# Patient Record
Sex: Female | Born: 1995 | Race: Black or African American | Hispanic: No | Marital: Single | Smoking: Never smoker
Health system: Southern US, Community
[De-identification: ages and names within clinical notes are randomized; demographics above are authoritative.]

## PROBLEM LIST (undated history)

## (undated) DIAGNOSIS — J45909 Unspecified asthma, uncomplicated: Secondary | ICD-10-CM

## (undated) DIAGNOSIS — G43909 Migraine, unspecified, not intractable, without status migrainosus: Secondary | ICD-10-CM

## (undated) DIAGNOSIS — Z8619 Personal history of other infectious and parasitic diseases: Secondary | ICD-10-CM

## (undated) DIAGNOSIS — B009 Herpesviral infection, unspecified: Secondary | ICD-10-CM

## (undated) HISTORY — PX: WISDOM TOOTH EXTRACTION: SHX21

## (undated) HISTORY — DX: Herpesviral infection, unspecified: B00.9

## (undated) HISTORY — PX: APPENDECTOMY: SHX54

## (undated) HISTORY — DX: Personal history of other infectious and parasitic diseases: Z86.19

## (undated) HISTORY — PX: TONSILLECTOMY AND ADENOIDECTOMY: SUR1326

---
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
## 2015-09-19 NOTE — L&D Delivery Note (Signed)
Delivery Note At 11:55 PM a viable female was delivered via Vaginal, Spontaneous Delivery (Presentation:DOP ).  A discussion was held with the patient regarding options secondary to prolonged second stage which included vacuum and c-section deliveries.  Patient declined VAVD and was able to ultimately push the baby out after 3 more contractions in DOP presentation.  APGAR: 7, 9; weight  .  Placenta status: spont intact .  Cord:  3V with the following complications: none.  Cord pH: n/a  Anesthesia:   Episiotomy: None Lacerations:  Partial 4th degree (start of rectal mucosa torn for about a cm or less) Suture Repair: 2.0 3.0 vicryl and 0 vicryl Est. Blood Loss (mL):  300cc  Mom to postpartum.  Baby to Couplet care / Skin to Skin.  Terry NailsROBERTS,Terry Ryser Y 09/15/2016, 12:49 AM

## 2015-12-22 LAB — OB RESULTS CONSOLE GC/CHLAMYDIA: Chlamydia: POSITIVE

## 2016-01-31 LAB — OB RESULTS CONSOLE ANTIBODY SCREEN: ANTIBODY SCREEN: NEGATIVE

## 2016-01-31 LAB — OB RESULTS CONSOLE GC/CHLAMYDIA: GC PROBE AMP, GENITAL: NEGATIVE

## 2016-01-31 LAB — OB RESULTS CONSOLE ABO/RH: RH TYPE: POSITIVE

## 2016-01-31 LAB — OB RESULTS CONSOLE RPR: RPR: NONREACTIVE

## 2016-01-31 LAB — OB RESULTS CONSOLE RUBELLA ANTIBODY, IGM: Rubella: IMMUNE

## 2016-01-31 LAB — OB RESULTS CONSOLE HIV ANTIBODY (ROUTINE TESTING): HIV: NONREACTIVE

## 2016-01-31 LAB — OB RESULTS CONSOLE HEPATITIS B SURFACE ANTIGEN: HEP B S AG: NEGATIVE

## 2016-08-15 LAB — OB RESULTS CONSOLE GC/CHLAMYDIA
Chlamydia: NEGATIVE
GC PROBE AMP, GENITAL: NEGATIVE

## 2016-08-15 LAB — OB RESULTS CONSOLE GBS: GBS: POSITIVE

## 2016-08-25 ENCOUNTER — Other Ambulatory Visit (HOSPITAL_COMMUNITY): Payer: Self-pay | Admitting: Obstetrics and Gynecology

## 2016-08-25 DIAGNOSIS — Z3A38 38 weeks gestation of pregnancy: Secondary | ICD-10-CM

## 2016-08-25 DIAGNOSIS — Z3689 Encounter for other specified antenatal screening: Secondary | ICD-10-CM

## 2016-08-25 DIAGNOSIS — O26849 Uterine size-date discrepancy, unspecified trimester: Secondary | ICD-10-CM

## 2016-09-01 ENCOUNTER — Ambulatory Visit (HOSPITAL_COMMUNITY)
Admission: RE | Admit: 2016-09-01 | Discharge: 2016-09-01 | Disposition: A | Discharge status: Home / Self Care | Payer: Managed Care, Other (non HMO) | Source: Ambulatory Visit | Attending: Obstetrics and Gynecology | Admitting: Obstetrics and Gynecology

## 2016-09-01 DIAGNOSIS — Z363 Encounter for antenatal screening for malformations: Secondary | ICD-10-CM | POA: Insufficient documentation

## 2016-09-01 DIAGNOSIS — Z3689 Encounter for other specified antenatal screening: Secondary | ICD-10-CM | POA: Insufficient documentation

## 2016-09-01 DIAGNOSIS — O26849 Uterine size-date discrepancy, unspecified trimester: Secondary | ICD-10-CM

## 2016-09-01 DIAGNOSIS — Z3A38 38 weeks gestation of pregnancy: Secondary | ICD-10-CM | POA: Insufficient documentation

## 2016-09-01 DIAGNOSIS — O26843 Uterine size-date discrepancy, third trimester: Secondary | ICD-10-CM | POA: Insufficient documentation

## 2016-09-01 NOTE — Addendum Note (Signed)
Encounter addended by: Lenoard Adenlivia M Cire Deyarmin, RT on: 09/01/2016  4:15 PM<BR>    Actions taken: Imaging Exam ended

## 2016-09-08 ENCOUNTER — Other Ambulatory Visit (HOSPITAL_COMMUNITY): Payer: Self-pay | Admitting: Obstetrics and Gynecology

## 2016-09-08 DIAGNOSIS — Z3A40 40 weeks gestation of pregnancy: Secondary | ICD-10-CM

## 2016-09-08 DIAGNOSIS — O480 Post-term pregnancy: Secondary | ICD-10-CM

## 2016-09-09 ENCOUNTER — Inpatient Hospital Stay (HOSPITAL_COMMUNITY)
Admission: AD | Admit: 2016-09-09 | Payer: Private Health Insurance - Indemnity | Source: Ambulatory Visit | Admitting: Obstetrics and Gynecology

## 2016-09-12 ENCOUNTER — Telehealth (HOSPITAL_COMMUNITY): Payer: Self-pay | Admitting: *Deleted

## 2016-09-12 ENCOUNTER — Encounter (HOSPITAL_COMMUNITY): Payer: Self-pay | Admitting: *Deleted

## 2016-09-12 NOTE — Telephone Encounter (Signed)
Preadmission screen  

## 2016-09-13 ENCOUNTER — Other Ambulatory Visit: Payer: Self-pay | Admitting: Obstetrics and Gynecology

## 2016-09-13 NOTE — H&P (Signed)
Terry Mcdaniel is a 20 y.o. female  G1 P0 at 40 wks and 5 days EGA based on 8 wk ultrasound with EDD 09/09/2016 presenting for induction due to postdates.  Prentaal care provided by Dr. Gerald Leitzara Mercedez Boule with [facility]. 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 Pt denies any current outbreak.   Ultrasound 09/01/2016 fetus was 8 lbs 6 oz >90%ile.    OB History    Gravida Para Term Preterm AB Living   1             SAB TAB Ectopic Multiple Live Births                 Past Medical History:  Diagnosis Date  . Asthma   . Herpes   . History of chlamydia   . Migraine    Past Surgical History:  Procedure Laterality Date  . LAPAROSCOPIC APPENDECTOMY  05/15/2012   Procedure: APPENDECTOMY LAPAROSCOPIC;  Surgeon: Judie PetitM. Leonia CoronaShuaib Farooqui, MD;  Location: [facility] OR;  Service: Pediatrics;  Laterality: N/A;  . TONSILLECTOMY AND ADENOIDECTOMY     Family History: family history includes ADD / ADHD in her other; Autism in her maternal aunt; Bipolar disorder in her other; Breast cancer in her maternal grandmother; Hearing loss in her cousin; Migraines in her mother and paternal aunt; Tics in her other. Social History:  reports that she has never smoked. She has never used smokeless tobacco. She reports that she does not drink alcohol or use drugs.   Allergies : Penicillin Medication: Valtrex/ Prenatal vitamins.      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   HENT: Negative.   Eyes: Negative.   Respiratory: Negative.   Cardiovascular: Negative.   Gastrointestinal: Negative.   Genitourinary: Negative.   Musculoskeletal:  Negative.   Skin: Negative.   Neurological: Negative.   Endo/Heme/Allergies: Negative.   Psychiatric/Behavioral: Negative.    Maternal Medical History:  Fetal activity: Perceived fetal activity is normal.    Prenatal complications: Infection.   Prenatal Complications - Diabetes: none.      There were no vitals taken for this visit. Maternal Exam:  Uterine Assessment: Contraction frequency is irregular.   Abdomen: Patient reports no abdominal tenderness. Estimated fetal weight is 9 lbs 0 oz .   Fetal presentation: vertex  Introitus: Normal vulva. Normal vagina.  Pelvis: adequate for delivery.      Physical Exam  Vitals reviewed. Constitutional: She is oriented to person, place, and time. She appears well-developed and well-nourished.  HENT:  Head: Normocephalic and atraumatic.  Eyes: Conjunctivae are normal. Pupils are equal, round, and reactive to light.  Neck: Normal range of motion. Neck supple.  Cardiovascular: Normal rate and regular rhythm.   Respiratory: Effort normal and breath sounds normal.  GI: Bowel sounds are normal. There is no tenderness.  Genitourinary: Vagina normal.  Musculoskeletal: Normal range of motion. She exhibits no edema.  Neurological: She is alert and oriented to person, place, and time. She has normal reflexes.  Skin: Skin is warm and dry.  Psychiatric: She has a normal mood and affect.    Prenatal labs: ABO, Rh: A/Positive/-- (05/15 0000) Antibody: Negative (05/15 0000) Rubella: Immune (  05/15 0000) RPR: Nonreactive (05/15 0000)  HBsAg: Negative (05/15 0000)  HIV: Non-reactive (05/15 0000)  GBS: Positive (11/28 0000)   Assessment/Plan:  40 wks and 5 days post dates for induction. Increased r/o cesarean section associated with induction was discussed with the patient and she accepts this risk.  Plan pitocin for induction  Clindamycin for GBS prophylaxis as pt is allergic to penicllin.  HSV no active lesions on exam in the office.  Dr.  Charlotta Newtonzan covering midnight 09/14/2016 until 7am .  I will assume care at 7am on 09/14/2016.  Duong Haydel J. 09/13/2016, 5:23 PM

## 2016-09-14 ENCOUNTER — Inpatient Hospital Stay (HOSPITAL_COMMUNITY): Payer: Managed Care, Other (non HMO) | Admitting: Anesthesiology

## 2016-09-14 ENCOUNTER — Encounter (HOSPITAL_COMMUNITY): Payer: Self-pay

## 2016-09-14 ENCOUNTER — Inpatient Hospital Stay (HOSPITAL_COMMUNITY)
Admission: RE | Admit: 2016-09-14 | Discharge: 2016-09-16 | DRG: 775 | Disposition: A | Discharge status: Home / Self Care | Payer: Managed Care, Other (non HMO) | Source: Ambulatory Visit | Attending: Obstetrics and Gynecology | Admitting: Obstetrics and Gynecology

## 2016-09-14 DIAGNOSIS — Z3A40 40 weeks gestation of pregnancy: Secondary | ICD-10-CM | POA: Diagnosis not present

## 2016-09-14 DIAGNOSIS — O480 Post-term pregnancy: Principal | ICD-10-CM | POA: Diagnosis present

## 2016-09-14 DIAGNOSIS — O1494 Unspecified pre-eclampsia, complicating childbirth: Secondary | ICD-10-CM | POA: Diagnosis present

## 2016-09-14 LAB — PROTEIN / CREATININE RATIO, URINE
CREATININE, URINE: 33 mg/dL
Creatinine, Urine: 65 mg/dL
PROTEIN CREATININE RATIO: 0.33 mg/mg{creat} — AB (ref 0.00–0.15)
PROTEIN CREATININE RATIO: 0.31 mg/mg{creat} — AB (ref 0.00–0.15)
TOTAL PROTEIN, URINE: 11 mg/dL
Total Protein, Urine: 20 mg/dL

## 2016-09-14 LAB — COMPREHENSIVE METABOLIC PANEL
ALT: 15 U/L (ref 14–54)
ANION GAP: 6 (ref 5–15)
AST: 25 U/L (ref 15–41)
Albumin: 2.7 g/dL — ABNORMAL LOW (ref 3.5–5.0)
Alkaline Phosphatase: 246 U/L — ABNORMAL HIGH (ref 38–126)
BUN: 7 mg/dL (ref 6–20)
CALCIUM: 8.7 mg/dL — AB (ref 8.9–10.3)
CHLORIDE: 111 mmol/L (ref 101–111)
CO2: 19 mmol/L — AB (ref 22–32)
Creatinine, Ser: 0.56 mg/dL (ref 0.44–1.00)
GFR calc Af Amer: >60 mL/min (ref >60)
GFR calc non Af Amer: >60 mL/min (ref >60)
Glucose, Bld: 82 mg/dL (ref 65–99)
POTASSIUM: 3.9 mmol/L (ref 3.5–5.1)
SODIUM: 136 mmol/L (ref 135–145)
Total Bilirubin: 0.3 mg/dL (ref 0.3–1.2)
Total Protein: 6.1 g/dL — ABNORMAL LOW (ref 6.5–8.1)

## 2016-09-14 LAB — ABO/RH: ABO/RH(D): A POS

## 2016-09-14 LAB — CBC
HCT: 38.1 % (ref 36.0–46.0)
HCT: 37.6 % (ref 36.0–46.0)
HEMOGLOBIN: 13.3 g/dL (ref 12.0–15.0)
Hemoglobin: 13.3 g/dL (ref 12.0–15.0)
MCH: 32.9 pg (ref 26.0–34.0)
MCH: 33.3 pg (ref 26.0–34.0)
MCHC: 34.9 g/dL (ref 30.0–36.0)
MCHC: 35.4 g/dL (ref 30.0–36.0)
MCV: 94.3 fL (ref 78.0–100.0)
MCV: 94.2 fL (ref 78.0–100.0)
PLATELETS: 231 10*3/uL (ref 150–400)
PLATELETS: 221 10*3/uL (ref 150–400)
RBC: 4.04 MIL/uL (ref 3.87–5.11)
RBC: 3.99 MIL/uL (ref 3.87–5.11)
RDW: 13.5 % (ref 11.5–15.5)
RDW: 14 % (ref 11.5–15.5)
WBC: 11.6 10*3/uL — AB (ref 4.0–10.5)
WBC: 10.2 10*3/uL (ref 4.0–10.5)

## 2016-09-14 LAB — TYPE AND SCREEN
ABO/RH(D): A POS
ANTIBODY SCREEN: NEGATIVE

## 2016-09-14 LAB — URIC ACID: URIC ACID, SERUM: 4.7 mg/dL (ref 2.3–6.6)

## 2016-09-14 LAB — RPR: RPR: NONREACTIVE

## 2016-09-14 MED ORDER — OXYCODONE-ACETAMINOPHEN 5-325 MG PO TABS: 1.0000 | ORAL_TABLET | ORAL | Status: DC | PRN

## 2016-09-14 MED ORDER — PHENYLEPHRINE 40 MCG/ML (10ML) SYRINGE FOR IV PUSH (FOR BLOOD PRESSURE SUPPORT)
80.0000 ug | PREFILLED_SYRINGE | INTRAVENOUS | Status: DC | PRN
  Filled 2016-09-14: qty 5

## 2016-09-14 MED ORDER — EPHEDRINE 5 MG/ML INJ
10.0000 mg | INTRAVENOUS | Status: DC | PRN
  Filled 2016-09-14: qty 4

## 2016-09-14 MED ORDER — ONDANSETRON HCL 4 MG/2ML IJ SOLN: 4.0000 mg | Freq: Four times a day (QID) | INTRAMUSCULAR | Status: DC | PRN

## 2016-09-14 MED ORDER — BUTORPHANOL TARTRATE 1 MG/ML IJ SOLN
1.0000 mg | INTRAMUSCULAR | Status: DC | PRN
  Administered 2016-09-14 (×2): 1 mg via INTRAVENOUS
  Filled 2016-09-14 (×2): qty 1

## 2016-09-14 MED ORDER — PHENYLEPHRINE 40 MCG/ML (10ML) SYRINGE FOR IV PUSH (FOR BLOOD PRESSURE SUPPORT)
80.0000 ug | PREFILLED_SYRINGE | INTRAVENOUS | Status: DC | PRN
  Filled 2016-09-14: qty 5
  Filled 2016-09-14: qty 10

## 2016-09-14 MED ORDER — HYDROXYZINE HCL 50 MG PO TABS
50.0000 mg | ORAL_TABLET | Freq: Four times a day (QID) | ORAL | Status: DC | PRN
  Filled 2016-09-14: qty 1

## 2016-09-14 MED ORDER — CLINDAMYCIN PHOSPHATE 900 MG/50ML IV SOLN
900.0000 mg | Freq: Three times a day (TID) | INTRAVENOUS | Status: DC
  Administered 2016-09-14 (×3): 900 mg via INTRAVENOUS
  Filled 2016-09-14 (×6): qty 50

## 2016-09-14 MED ORDER — OXYTOCIN 40 UNITS IN LACTATED RINGERS INFUSION - SIMPLE MED
1.0000 m[IU]/min | INTRAVENOUS | Status: DC
Start: 2016-09-14 — End: 2016-09-15
  Administered 2016-09-14: 1 m[IU]/min via INTRAVENOUS

## 2016-09-14 MED ORDER — OXYTOCIN 40 UNITS IN LACTATED RINGERS INFUSION - SIMPLE MED
1.0000 m[IU]/min | INTRAVENOUS | Status: DC
  Administered 2016-09-14: 1 m[IU]/min via INTRAVENOUS
  Filled 2016-09-14: qty 1000

## 2016-09-14 MED ORDER — LACTATED RINGERS IV SOLN
500.0000 mL | Freq: Once | INTRAVENOUS | Status: AC
  Administered 2016-09-14: 1000 mL via INTRAVENOUS

## 2016-09-14 MED ORDER — FENTANYL 2.5 MCG/ML BUPIVACAINE 1/10 % EPIDURAL INFUSION (WH - ANES)
14.0000 mL/h | INTRAMUSCULAR | Status: DC | PRN
  Administered 2016-09-14 (×2): 14 mL/h via EPIDURAL
  Filled 2016-09-14 (×2): qty 100

## 2016-09-14 MED ORDER — MISOPROSTOL 200 MCG PO TABS
ORAL_TABLET | ORAL | Status: AC
  Filled 2016-09-14: qty 4

## 2016-09-14 MED ORDER — LIDOCAINE HCL (PF) 1 % IJ SOLN
INTRAMUSCULAR | Status: DC | PRN
  Administered 2016-09-14 (×2): 5 mL via EPIDURAL

## 2016-09-14 MED ORDER — ACETAMINOPHEN 325 MG PO TABS
650.0000 mg | ORAL_TABLET | ORAL | Status: DC | PRN
  Administered 2016-09-14: 650 mg via ORAL
  Filled 2016-09-14: qty 2

## 2016-09-14 MED ORDER — LACTATED RINGERS IV SOLN
500.0000 mL | INTRAVENOUS | Status: DC | PRN
  Administered 2016-09-14: 1000 mL via INTRAVENOUS

## 2016-09-14 MED ORDER — OXYTOCIN 40 UNITS IN LACTATED RINGERS INFUSION - SIMPLE MED
2.5000 [IU]/h | INTRAVENOUS | Status: DC
  Administered 2016-09-15: 2.5 [IU]/h via INTRAVENOUS
  Filled 2016-09-14: qty 1000

## 2016-09-14 MED ORDER — SOD CITRATE-CITRIC ACID 500-334 MG/5ML PO SOLN: 30.0000 mL | ORAL | Status: DC | PRN

## 2016-09-14 MED ORDER — LACTATED RINGERS IV SOLN
INTRAVENOUS | Status: DC
  Administered 2016-09-14 (×2): via INTRAVENOUS

## 2016-09-14 MED ORDER — DIPHENHYDRAMINE HCL 50 MG/ML IJ SOLN: 12.5000 mg | INTRAMUSCULAR | Status: DC | PRN

## 2016-09-14 MED ORDER — TERBUTALINE SULFATE 1 MG/ML IJ SOLN
0.2500 mg | Freq: Once | INTRAMUSCULAR | Status: DC | PRN
  Filled 2016-09-14: qty 1

## 2016-09-14 MED ORDER — OXYCODONE-ACETAMINOPHEN 5-325 MG PO TABS: 2.0000 | ORAL_TABLET | ORAL | Status: DC | PRN

## 2016-09-14 MED ORDER — OXYTOCIN BOLUS FROM INFUSION
500.0000 mL | Freq: Once | INTRAVENOUS | Status: AC
  Administered 2016-09-14: 500 mL via INTRAVENOUS

## 2016-09-14 MED ORDER — LIDOCAINE HCL (PF) 1 % IJ SOLN
30.0000 mL | INTRAMUSCULAR | Status: DC | PRN
  Filled 2016-09-14: qty 30

## 2016-09-14 NOTE — Progress Notes (Signed)
Terry HanksBriana M Mcdaniel is a 20 y.o. G1P0000 at 4918w5d by ultrasound admitted for induction of labor due to Post dates. Due date 09/09/2016.  Subjective: Pt states contractions are uncomfortable not painful yet. +FM no leakage of fluid. She denies any active HSV lesions   Objective: BP 122/78   Pulse 81   Temp 99 F (37.2 C) (Oral)   Resp 20   Ht 5\' 4"  (1.626 m)   Wt 78.4 kg (172 lb 12.8 oz)   SpO2 100%   BMI 29.66 kg/m  No intake/output data recorded. No intake/output data recorded.  FHT:  FHR: 125 bpm, variability: moderate,  accelerations:  Present,  decelerations:  Absent UC:   irregular, every 1-5 minutes SVE:   Dilation: 2.5 Effacement (%): 90 Station: -2 Exam by:: Occidental Petroleumkristy lashley,rn   Perineum examined no active lesions seen.  Labs: Lab Results  Component Value Date   WBC 11.6 (H) 09/14/2016   HGB 13.3 09/14/2016   HCT 38.1 09/14/2016   MCV 94.3 09/14/2016   PLT 231 09/14/2016    Assessment / Plan: Induction of labor due to postterm,  progressing well on pitocin  Labor: continue pitocine will increase to go up every 30 minutes by 2 mu  Preeclampsia:  NA Fetal Wellbeing:  Category I Pain Control:  pt planning on epidural  I/D:  clindamycin Anticipated MOD:  NSVD  Terry Mcdaniel J. 09/14/2016, 7:31 AM

## 2016-09-14 NOTE — Anesthesia Procedure Notes (Signed)
Epidural Patient location during procedure: OB Start time: 09/14/2016 1:51 PM End time: 09/14/2016 2:01 PM  Staffing Anesthesiologist: Heather RobertsSINGER, Kashia Brossard Performed: anesthesiologist   Preanesthetic Checklist Completed: patient identified, site marked, pre-op evaluation, timeout performed, IV checked, risks and benefits discussed and monitors and equipment checked  Epidural Patient position: sitting Prep: DuraPrep Patient monitoring: heart rate, cardiac monitor, continuous pulse ox and blood pressure Approach: midline Location: L2-L3 Injection technique: LOR saline  Needle:  Needle type: Tuohy  Needle gauge: 17 G Needle length: 9 cm Needle insertion depth: 6 cm Catheter size: 20 Guage Catheter at skin depth: 11 cm Test dose: negative and Other  Assessment Events: blood not aspirated, injection not painful, no injection resistance and negative IV test  Additional Notes Informed consent obtained prior to proceeding including risk of failure, 1% risk of PDPH, risk of minor discomfort and bruising.  Discussed rare but serious complications including epidural abscess, permanent nerve injury, epidural hematoma.  Discussed alternatives to epidural analgesia and patient desires to proceed.  Timeout performed pre-procedure verifying patient name, procedure, and platelet count.  Patient tolerated procedure well.

## 2016-09-14 NOTE — Anesthesia Pain Management Evaluation Note (Signed)
  CRNA Pain Management Visit Note  Patient: Terry Mcdaniel, 20 y.o., female  "Hello I am a member of the anesthesia team at [facility]. We have an anesthesia team available at all times to provide care throughout the hospital, including epidural management and anesthesia for C-section. I don't know your plan for the delivery whether it a natural birth, water birth, IV sedation, nitrous supplementation, doula or epidural, but we want to meet your pain goals."   1.Was your pain managed to your expectations on prior hospitalizations?   Yes   2.What is your expectation for pain management during this hospitalization?     Epidural  3.How can we help you reach that goal? epidural  Record the patient's initial score and the patient's pain goal.   Pain: 0/10  Pain Goal: 0/10 [facility] wants you to be able to say your pain was always managed very well.  Terry Mcdaniel, [name] 09/14/2016

## 2016-09-14 NOTE — Progress Notes (Signed)
Margit HanksBriana M Szafran is a 20 y.o. G1P0000 at 4620w5d by ultrasound admitted for induction of labor due to Post dates. Due date 09/09/2016.  Subjective: Patient reports contractions more intense. No lof .Marland Kitchen. Bloody show present.   Objective: BP 133/86 (BP Location: Right Arm)   Pulse 82   Temp 98 F (36.7 C) (Oral)   Resp 18   Ht 5\' 4"  (1.626 m)   Wt 78.4 kg (172 lb 12.8 oz)   SpO2 100%   BMI 29.66 kg/m  No intake/output data recorded. No intake/output data recorded.  FHT:  FHR: 120 bpm, variability: moderate,  accelerations:  Present,  decelerations:  Absent UC:   regular, every 2 minutes SVE:  4-5/90/-2 Labs: Lab Results  Component Value Date   WBC 10.2 09/14/2016   HGB 13.3 09/14/2016   HCT 37.6 09/14/2016   MCV 94.2 09/14/2016   PLT 221 09/14/2016    Assessment / Plan: Induction of labor due to postterm,  progressing well on pitocin  Labor: Progressing on Pitocin, will continue to increase then AROM Preeclampsia:  NA Fetal Wellbeing:  Category I Pain Control:  IV pain meds I/D:  Clindamycin Anticipated MOD:  NSVD  Taneia Mealor J. 09/14/2016, 1:29 PM

## 2016-09-14 NOTE — Progress Notes (Signed)
Terry HanksBriana M Mcdaniel is a 20 y.o. G1P0000 at 3653w5d by ultrasound admitted for induction of labor due to Post dates. Due date 09/09/2016.  Subjective: Patient reports feeling more pressure. She feels like she needs to have a bowel movement.  She is comfortable with her epidural   Objective: BP (!) 129/98 (BP Location: Right Arm) Comment: pushing  Pulse 97   Temp 97.7 F (36.5 C) (Oral)   Resp 20   Ht 5\' 4"  (1.626 m)   Wt 78.4 kg (172 lb 12.8 oz)   SpO2 99%   BMI 29.66 kg/m  No intake/output data recorded. Total I/O In: -  Out: 550 [Urine:550]  FHT:  FHR: 115 bpm, variability: moderate,  accelerations:  Present,  decelerations:  Absent UC:   regular, every 1=2 minutes SVE:   Dilation: 10 Effacement (%): 100 Station: +1 Exam by:: Dr. Richardson Doppole  Labs: Lab Results  Component Value Date   WBC 10.2 09/14/2016   HGB 13.3 09/14/2016   HCT 37.6 09/14/2016   MCV 94.2 09/14/2016   PLT 221 09/14/2016    Assessment / Plan: Induction of labor due to postterm,  progressing well on pitocin  Labor: completely dilated feeling more pressure will start pushing  Preeclampsia:  repeat protein creatinine ratio as the first sample contained blood. submitted a urine catheterized sample  Fetal Wellbeing:  Category I Pain Control:  Epidural I/D:  Clindamycin Anticipated MOD:  NSVD  Terry Mcdaniel prothero CNM and Dr. Osborn CohoAngela Mcdaniel assuming care at 7 pm...   Terry Tift J. 09/14/2016, 6:54 PM

## 2016-09-14 NOTE — Anesthesia Preprocedure Evaluation (Signed)
Anesthesia Evaluation  Patient identified by MRN, date of birth, ID band Patient awake    Reviewed: Allergy & Precautions, NPO status , Patient's Chart, lab work & pertinent test results  Airway Mallampati: II  TM Distance: >3 FB Neck ROM: Full    Dental no notable dental hx. (+) Dental Advisory Given   Pulmonary asthma ,    Pulmonary exam normal        Cardiovascular negative cardio ROS Normal cardiovascular exam     Neuro/Psych  Headaches, negative psych ROS   GI/Hepatic negative GI ROS, Neg liver ROS,   Endo/Other  negative endocrine ROS  Renal/GU negative Renal ROS  negative genitourinary   Musculoskeletal negative musculoskeletal ROS (+)   Abdominal   Peds negative pediatric ROS (+)  Hematology negative hematology ROS (+)   Anesthesia Other Findings   Reproductive/Obstetrics (+) Pregnancy                             Anesthesia Physical Anesthesia Plan  ASA: II  Anesthesia Plan: Epidural   Post-op Pain Management:    Induction:   Airway Management Planned: Natural Airway and Simple Face Mask  Additional Equipment:   Intra-op Plan:   Post-operative Plan:   Informed Consent: I have reviewed the patients History and Physical, chart, labs and discussed the procedure including the risks, benefits and alternatives for the proposed anesthesia with the patient or authorized representative who has indicated his/her understanding and acceptance.   Dental advisory given  Plan Discussed with:   Anesthesia Plan Comments:         Anesthesia Quick Evaluation

## 2016-09-14 NOTE — Progress Notes (Signed)
Subjective: Starting to feel a little more pressure.  Support in room. Objective: BP 138/89   Pulse 86   Temp 98.3 F (36.8 C) (Oral)   Resp 18   Ht 5\' 4"  (1.626 m)   Wt 78.4 kg (172 lb 12.8 oz)   SpO2 99%   BMI 29.66 kg/m  I/O last 3 completed shifts: In: -  Out: 550 [Urine:550] No intake/output data recorded.  UPC .31  FHT: Category 1 UC:   Coupling and every 3. SVE:   Dilation: 10 Effacement (%): 100 Station: 0 Exam by:: nancy prothero,cnm Pitocin at 1 mu   Assessment:  Pt is a 20 yo G1P0 at 40.5 IUP active labor Cat 1 strip Pre Eclampsia without severe features Plan: Labor down 30 minutes and resume pushing Dr. Su Hiltoberts aware of poc  Kenney HousemanNancy Jean Prothero CNM, MSN 09/14/2016, 8:27 PM

## 2016-09-15 ENCOUNTER — Ambulatory Visit (HOSPITAL_COMMUNITY): Payer: Managed Care, Other (non HMO)

## 2016-09-15 ENCOUNTER — Encounter (HOSPITAL_COMMUNITY): Payer: Self-pay

## 2016-09-15 LAB — CBC
HCT: 39 % (ref 36.0–46.0)
HCT: 37.3 % (ref 36.0–46.0)
HEMATOCRIT: 36.4 % (ref 36.0–46.0)
HEMOGLOBIN: 13.9 g/dL (ref 12.0–15.0)
HEMOGLOBIN: 12.8 g/dL (ref 12.0–15.0)
HEMOGLOBIN: 13 g/dL (ref 12.0–15.0)
MCH: 33.2 pg (ref 26.0–34.0)
MCH: 32.5 pg (ref 26.0–34.0)
MCH: 32.7 pg (ref 26.0–34.0)
MCHC: 35.6 g/dL (ref 30.0–36.0)
MCHC: 34.9 g/dL (ref 30.0–36.0)
MCHC: 35.2 g/dL (ref 30.0–36.0)
MCV: 93.1 fL (ref 78.0–100.0)
MCV: 92.9 fL (ref 78.0–100.0)
MCV: 93.3 fL (ref 78.0–100.0)
Platelets: 213 10*3/uL (ref 150–400)
Platelets: 211 10*3/uL (ref 150–400)
Platelets: 211 10*3/uL (ref 150–400)
RBC: 4.19 MIL/uL (ref 3.87–5.11)
RBC: 3.92 MIL/uL (ref 3.87–5.11)
RBC: 4 MIL/uL (ref 3.87–5.11)
RDW: 14 % (ref 11.5–15.5)
RDW: 14 % (ref 11.5–15.5)
RDW: 14 % (ref 11.5–15.5)
WBC: 15.7 10*3/uL — AB (ref 4.0–10.5)
WBC: 14.2 10*3/uL — ABNORMAL HIGH (ref 4.0–10.5)
WBC: 14.9 10*3/uL — AB (ref 4.0–10.5)

## 2016-09-15 LAB — COMPREHENSIVE METABOLIC PANEL
ALK PHOS: 218 U/L — AB (ref 38–126)
ALT: 16 U/L (ref 14–54)
ANION GAP: 9 (ref 5–15)
AST: 40 U/L (ref 15–41)
Albumin: 2.5 g/dL — ABNORMAL LOW (ref 3.5–5.0)
BUN: 8 mg/dL (ref 6–20)
CALCIUM: 8.7 mg/dL — AB (ref 8.9–10.3)
CO2: 19 mmol/L — AB (ref 22–32)
CREATININE: 0.76 mg/dL (ref 0.44–1.00)
Chloride: 107 mmol/L (ref 101–111)
Glucose, Bld: 112 mg/dL — ABNORMAL HIGH (ref 65–99)
Potassium: 3.7 mmol/L (ref 3.5–5.1)
SODIUM: 135 mmol/L (ref 135–145)
TOTAL PROTEIN: 5.9 g/dL — AB (ref 6.5–8.1)
Total Bilirubin: 0.6 mg/dL (ref 0.3–1.2)

## 2016-09-15 LAB — LACTATE DEHYDROGENASE: LDH: 323 U/L — AB (ref 98–192)

## 2016-09-15 LAB — URIC ACID: URIC ACID, SERUM: 6 mg/dL (ref 2.3–6.6)

## 2016-09-15 MED ORDER — BENZOCAINE-MENTHOL 20-0.5 % EX AERO
1.0000 "application " | INHALATION_SPRAY | CUTANEOUS | Status: DC | PRN
  Administered 2016-09-15: 1 via TOPICAL
  Filled 2016-09-15: qty 56

## 2016-09-15 MED ORDER — ONDANSETRON HCL 4 MG/2ML IJ SOLN: 4.0000 mg | INTRAMUSCULAR | Status: DC | PRN

## 2016-09-15 MED ORDER — ALBUTEROL SULFATE (2.5 MG/3ML) 0.083% IN NEBU: 3.0000 mL | INHALATION_SOLUTION | Freq: Four times a day (QID) | RESPIRATORY_TRACT | Status: DC | PRN

## 2016-09-15 MED ORDER — PRENATAL MULTIVITAMIN CH
1.0000 | ORAL_TABLET | Freq: Every day | ORAL | Status: DC
  Administered 2016-09-15: 1 via ORAL
  Filled 2016-09-15: qty 1

## 2016-09-15 MED ORDER — VALACYCLOVIR HCL 500 MG PO TABS
500.0000 mg | ORAL_TABLET | Freq: Every day | ORAL | Status: DC
  Administered 2016-09-15: 500 mg via ORAL
  Filled 2016-09-15: qty 1

## 2016-09-15 MED ORDER — ZOLPIDEM TARTRATE 5 MG PO TABS: 5.0000 mg | ORAL_TABLET | Freq: Every evening | ORAL | Status: DC | PRN

## 2016-09-15 MED ORDER — SIMETHICONE 80 MG PO CHEW: 80.0000 mg | CHEWABLE_TABLET | ORAL | Status: DC | PRN

## 2016-09-15 MED ORDER — SENNOSIDES-DOCUSATE SODIUM 8.6-50 MG PO TABS
2.0000 | ORAL_TABLET | ORAL | Status: DC
  Administered 2016-09-15: 2 via ORAL
  Filled 2016-09-15: qty 2

## 2016-09-15 MED ORDER — IBUPROFEN 600 MG PO TABS
600.0000 mg | ORAL_TABLET | Freq: Four times a day (QID) | ORAL | Status: DC
  Administered 2016-09-15 – 2016-09-16 (×6): 600 mg via ORAL
  Filled 2016-09-15 (×5): qty 1

## 2016-09-15 MED ORDER — COCONUT OIL OIL: 1.0000 "application " | TOPICAL_OIL | Status: DC | PRN

## 2016-09-15 MED ORDER — WITCH HAZEL-GLYCERIN EX PADS: 1.0000 "application " | MEDICATED_PAD | CUTANEOUS | Status: DC | PRN

## 2016-09-15 MED ORDER — ONDANSETRON HCL 4 MG PO TABS: 4.0000 mg | ORAL_TABLET | ORAL | Status: DC | PRN

## 2016-09-15 MED ORDER — DIPHENHYDRAMINE HCL 25 MG PO CAPS: 25.0000 mg | ORAL_CAPSULE | Freq: Four times a day (QID) | ORAL | Status: DC | PRN

## 2016-09-15 MED ORDER — DIBUCAINE 1 % RE OINT: 1.0000 "application " | TOPICAL_OINTMENT | RECTAL | Status: DC | PRN

## 2016-09-15 MED ORDER — TETANUS-DIPHTH-ACELL PERTUSSIS 5-2.5-18.5 LF-MCG/0.5 IM SUSP: 0.5000 mL | Freq: Once | INTRAMUSCULAR | Status: DC

## 2016-09-15 MED ORDER — ACETAMINOPHEN 325 MG PO TABS: 650.0000 mg | ORAL_TABLET | ORAL | Status: DC | PRN

## 2016-09-15 NOTE — Lactation Note (Signed)
This note was copied from a baby's chart. Lactation Consultation Note  Patient Name: Terry Mcdaniel: 09/15/2016 Reason for consult: Initial assessment  With this mom and term baby, now 8014 hours old. Mom was able to get baby latched, but he would not suckle.. I assisted mom with latching baby in cross cdadle hold, hand expressed colostrum easily, and baby latched easily, few suckles, and needed constant stimulation to get him to intermittently suck at the breast. After about 15 minutes of above, Mom agreed to me hand expressing her colostrum and spoon feeding the baby. He suckled 4 ml's from the spoon, doing well with tongue extension and suction. Some basci breast feeding teaching done, and lactation services briefly reviewed with mom. Mom knows to call for questions/conerns.    Maternal Data Formula Feeding for Exclusion: No Has patient been taught Hand Expression?: Yes Does the patient have breastfeeding experience prior to this delivery?: No  Feeding Feeding Type: Breast Milk Length of feed:  (on and off sucking for 20 minutes, mostly off)  LATCH Score/Interventions Latch: Repeated attempts needed to sustain latch, nipple held in mouth throughout feeding, stimulation needed to elicit sucking reflex. Intervention(s): Teach feeding cues;Skin to skin;Waking techniques Intervention(s): Adjust position;Assist with latch;Breast massage;Breast compression  Audible Swallowing: A few with stimulation (easily expressed colosstrum) Intervention(s): Skin to skin;Hand expression Intervention(s): Skin to skin;Hand expression;Alternate breast massage  Type of Nipple: Everted at rest and after stimulation  Comfort (Breast/Nipple): Soft / non-tender     Hold (Positioning): Assistance needed to correctly position infant at breast and maintain latch. Intervention(s): Breastfeeding basics reviewed;Support Pillows  LATCH Score: 7  Lactation Tools Discussed/Used     Consult  Status Consult Status: Follow-up Mcdaniel: 09/16/16 Follow-up type: In-patient    Alfred LevinsLee, Ceara Wrightson Anne 09/15/2016, 3:52 PM

## 2016-09-15 NOTE — Progress Notes (Signed)
Pt no urge to void.  Pt straight cath recv'd 850 ml urine output. Vagina and labia swollen. pericare given ice pack applied. Stable pt transfer via WC.

## 2016-09-15 NOTE — Progress Notes (Signed)
Postpartum Note Day # 0  S:  Patient resting comfortable in bed.  Pain controlled.  Tolerating general diet. + flatus, no BM.  Lochia minimal.  Ambulating without difficulty.  She denies n/v/f/c, SOB, or CP.  No headache, no blurry vision, no RUQ pain.  Pt plans on breastfeeding and reports difficulty with latching.  O: Temp:  [97.7 F (36.5 C)-98.8 F (37.1 C)] 98.6 F (37 C) (12/29 0506) Pulse Rate:  [47-146] 99 (12/29 0506) Resp:  [18-20] 18 (12/29 0506) BP: (102-155)/(63-105) 102/78 (12/29 0506) SpO2:  [81 %-100 %] 99 % (12/29 0506) Gen: A&Ox3, NAD CV: RRR, no MRG Resp: CTAB Abdomen: soft, NT, ND Uterus: firm, non-tender, below umbilicus Ext: No edema, no calf tenderness bilaterally,   Labs:  CBC Latest Ref Rng & Units 09/15/2016 09/15/2016 09/15/2016  WBC 4.0 - 10.5 K/uL 14.9(H) 14.2(H) 15.7(H)  Hemoglobin 12.0 - 15.0 g/dL 16.112.8 09.613.0 04.513.9  Hematocrit 36.0 - 46.0 % 36.4 37.3 39.0  Platelets 150 - 400 K/uL 211 211 213     A/P: Pt is a 20 y.o. G1P1001 s/p NSVD with partial 4th degree laceration and preeclampsia without severe features. PPD# 0  -Preeclampsia, no severe features  -no Magnesium, pt asymptomatic, no severe range BPs since delivery, will continue to closely monitor  - Pain well controlled -GU: UOP is adequate -GI: Tolerating general diet -Activity: encouraged sitting up to chair and ambulation as tolerated -Prophylaxis: early ambulation -Labs: H/H stable as above, baselines preeclampsia labs within normal limits except PC ratio: 0.33 -Outpatient baby boy circ -breastfeeding difficulties, lactation support today  DISPO: Continue with routine postpartum care.  Myna HidalgoJennifer Shevon Sian, DO [phone] (pager) [phone] (office)

## 2016-09-15 NOTE — Anesthesia Postprocedure Evaluation (Signed)
Anesthesia Post Note  Patient: Terry Mcdaniel  Procedure(s) Performed: * No procedures listed *  Patient location during evaluation: Mother Baby Anesthesia Type: Epidural Level of consciousness: awake and alert Pain management: pain level controlled Vital Signs Assessment: post-procedure vital signs reviewed and stable Respiratory status: spontaneous breathing and nonlabored ventilation Cardiovascular status: stable Postop Assessment: no headache, patient able to bend at knees, no backache, no signs of nausea or vomiting, epidural receding and adequate PO intake Anesthetic complications: no        Last Vitals:  Vitals:   09/15/16 0406 09/15/16 0506  BP: (!) 152/83 102/78  Pulse: 98 99  Resp: 19 18  Temp: 37.1 C 37 C    Last Pain:  Vitals:   09/15/16 0506  TempSrc: Oral  PainSc: 2    Pain Goal: Patients Stated Pain Goal: 5 (09/14/16 0730)               Laban EmperorMalinova,Janes Colegrove Hristova

## 2016-09-16 MED ORDER — IBUPROFEN 600 MG PO TABS
600.0000 mg | ORAL_TABLET | Freq: Four times a day (QID) | ORAL | 0 refills | Status: DC
Start: 2016-09-16 — End: 2019-06-11

## 2016-09-16 NOTE — Lactation Note (Signed)
This note was copied from a baby's chart. Lactation Consultation Note  Mother tearful and states she wants to formula feed. Suggest she discuss volume amounts with [name] RN.   Patient Name: Terry Mcdaniel: 09/16/2016 Reason for consult: Follow-up assessment   Maternal Data    Feeding Feeding Type: Breast Fed Length of feed: 8 min  LATCH Score/Interventions Latch: Grasps breast easily, tongue down, lips flanged, rhythmical sucking.  Audible Swallowing: A few with stimulation  Type of Nipple: Flat Intervention(s): Hand pump;Double electric pump  Comfort (Breast/Nipple): Soft / non-tender     Hold (Positioning): Assistance needed to correctly position infant at breast and maintain latch.  LATCH Score: 7  Lactation Tools Discussed/Used Tools: Nipple Shields Nipple shield size: 20 Pump Review: Setup, frequency, and cleaning;Milk Storage Initiated by:: [name] RN Mcdaniel initiated:: 09/16/16   Consult Status Consult Status: Complete    Hardie PulleyBerkelhammer, Ladina Shutters Boschen 09/16/2016, 3:25 PM

## 2016-09-16 NOTE — Discharge Summary (Signed)
Obstetric Discharge Summary  Reason for Admission: induction of labor on 09/13/16 for postdates Prenatal Procedures: none Intrapartum Procedures:vaginal delivery by Dr . Su Hiltoberts with 4th degree tear Postpartum Procedures: none Complications-Operative and Postpartum: 4 degree perineal laceration  Hemoglobin  Date Value Ref Range Status  09/15/2016 12.8 12.0 - 15.0 g/dL Final  16/10/960412/29/2017 54.013.0 12.0 - 15.0 g/dL Final   HCT  Date Value Ref Range Status  09/15/2016 36.4 36.0 - 46.0 % Final  09/15/2016 37.3 36.0 - 46.0 % Final  Delivery Note At 11:55 PM a viable female was delivered via Vaginal, Spontaneous Delivery (Presentation:DOP ).  A discussion was held with the patient regarding options secondary to prolonged second stage which included vacuum and c-section deliveries.  Patient declined VAVD and was able to ultimately push the baby out after 3 more contractions in DOP presentation.  APGAR: 7, 9; weight  .  Placenta status: spont intact .  Cord:  3V with the following complications: none.  Cord pH: n/a  Anesthesia:   Episiotomy: None Lacerations:  Partial 4th degree (start of rectal mucosa torn for about a cm or less) Suture Repair: 2.0 3.0 vicryl and 0 vicryl Est. Blood Loss (mL):  300cc  Mom to postpartum.  Baby to Couplet care / Skin to Skin.  Terry NailsROBERTS,Terry Mcdaniel 09/15/2016, 12:49 AM  Discharge Diagnoses: Term Pregnancy-delivered  Physical exam:   General: normal Lochia: appropriate Uterine Fundus: @ U firm non-tender  Extremities: No evidence of DVT seen on physical exam. Edema none   Hospital course: Mother has more extensive tear and is having issues with breastfeeding  Date: 09/16/2016 Activity: unrestricted Diet: routine Medications: PNV and Ibuprofen Condition: stable  Breastfeeding:   Yes.   Contraception:  undecided  Instructions: refer to practice specific booklet Discharge to: home Follow-up Information    Mcdaniel,Terry J., MD. Schedule an appointment as  soon as possible for a visit in 1 week(s).   Specialty:  Obstetrics and Gynecology Why:   pt should make an appointment with nurse in 1 week for blood pressure check.. make an appointment in 6 wks with Dr. Richardson Doppole for postpartum visit.  Contact information: [address] [place] [address] [phone]           Newborn Data:   Baby female Name:   Terry PinkLori A Mcdaniel CNM 09/16/2016, 10:22 AM

## 2016-09-16 NOTE — Discharge Instructions (Signed)
Postpartum Care After Cesarean Delivery  The period of time right after you deliver your newborn is called the postpartum period.  What kind of medical care will I receive?  · You may continue to receive fluids and medicines through an IV tube inserted into one of your veins.  · You may have small, flexible tube (catheter) draining urine from your bladder into a bag outside of your body. The catheter will be removed as soon as possible.  · You may be given a squirt bottle to use when you go to the bathroom. You may use this until you are comfortable wiping as usual. To use the squirt bottle, follow these steps:  ? Before you urinate, fill the squirt bottle with warm water. The water should be warm. Do not use hot water.  ? After you urinate, while you are sitting on the toilet, use the squirt bottle to rinse the area around your urethra and vaginal opening. This rinses away any urine and blood.  ? You may do this instead of wiping. As you start healing, you may use the squirt bottle before wiping yourself. Make sure to wipe gently.  ? Fill the squirt bottle with clean water every time you use the bathroom.  · You will be given sanitary pads to wear.  · Your incision will be monitored to make sure it is healing properly. You will be told when it is safe for your stitches, staples, or skin adhesive tape to be removed.  What can I expect?  · You may not feel the need to urinate for several hours after delivery.  · You will have some soreness and pain in your abdomen. You may have a small amount of blood or clear fluid coming from your incision.  · If you are breastfeeding, you may have uterine contractions every time you breastfeed for up to several weeks postpartum. Uterine contractions help your uterus return to its normal size.  · It is normal to have vaginal bleeding (lochia) after delivery. The amount and appearance of lochia is often similar to a menstrual period in the first week after delivery. It will  gradually decrease over the next few weeks to a dry, yellow-brown discharge. For most women, lochia stops completely by 6-8 weeks after delivery. Vaginal bleeding can vary from woman to woman.  · Within the first few days after delivery, you may have breast engorgement. This is when your breasts feel heavy, full, and uncomfortable. Your breasts may also throb and feel hard, tightly stretched, warm, and tender. After this occurs, you may have milk leaking from your breasts. Your health care provider can help you relieve discomfort due to breast engorgement. Breast engorgement should go away within a few days.  · You may feel more sad or worried than normal due to hormonal changes after delivery. These feelings should not last more than a few days. If these feelings do not go away after several days, speak with your health care provider.  How should I care for myself?  · Tell your health care provider if you have pain or discomfort.  · Drink enough water to keep your urine clear or pale yellow.  · Wash your hands thoroughly with soap and water for at least 20 seconds after changing your sanitary pads or using the toilet, and before holding or feeding your baby.  · If you are not breastfeeding, avoid touching your breasts a lot. Doing this can make your breasts produce more milk.  · If   you become weak or lightheaded, or you feel like you might faint, ask for help before:  ? Getting out of bed.  ? Showering.  · Change your sanitary pads frequently. Watch for any changes in your flow, such as a sudden increase in volume, a change in color, or the passing of large blood clots. If you pass a blood clot from your vagina, save it to show to your health care provider. Do not flush blood clots down the toilet without having your health care provider look at them.  · Make sure that all your vaccinations are up to date. This can help protect you and your baby from getting certain diseases. You may need to have immunizations done  before you leave the hospital.  · If desired, talk with your health care provider about methods of family planning or birth control (contraception).  How can I start bonding with my baby?  Spending as much time as possible with your baby is very important. During this time, you and your baby can get to know each other and develop a bond. Having your baby stay with you in your room (rooming in) can give you time to get to know your baby. Rooming in can also help you become comfortable caring for your baby. Breastfeeding can also help you bond with your baby.  How can I plan for returning home with my baby?  · Make sure that you have a car seat installed in your vehicle.  ? Your car seat should be checked by a certified car seat installer to make sure that it is installed safely.  ? Make sure that your baby fits into the car seat safely.  · Ask your health care provider any questions you have about caring for yourself or your baby. Make sure that you are able to contact your health care provider with any questions after leaving the hospital.  This information is not intended to replace advice given to you by your health care provider. Make sure you discuss any questions you have with your health care provider.  Document Released: 05/29/2012 Document Revised: 02/07/2016 Document Reviewed: 08/09/2015  Elsevier Interactive Patient Education © 2017 [employer]

## 2016-09-16 NOTE — Progress Notes (Signed)
Discharge instructions reviewed with patient.  Patient states understanding of home care for self and baby, medications, activity, signs/symptoms to report to MD and return MD office visit for both..  Patients significant other and family will assist with her care @ home.  No home  equipment needed, patient has prescriptions and all personal belongings.  Patient ambulated for discharge in stable condition with staff without incident.

## 2016-09-16 NOTE — Lactation Note (Addendum)
This note was copied from a baby's chart. Lactation Consultation Note  Baby 34 hours old and has not been fed since 0345 today. Baby out of room for hearing screen. Discussed the importance of waking for feeds and placing baby STS. Mom encouraged to feed baby 8-12 times/24 hours and with feeding cues.  Mother states the NS she was given last night has helped. Suggest hand expressing and prepumping with hand pump before latching to help evert nipple. Left LC phone number and suggest she call when baby gets back from hearing screen.  Mother called baby back in room.  Undressed baby for feeding. Reviewed hand expression and spoon fed baby approx 4 ml of colostrum. Attempted bf but baby not sucking. Applied #20NS and baby sucked a few times a fell asleep. Reviewed waking techniques.  Encouraged breast massage during feeding to keep him active. Baby breastfed for approx 30 min and was spoon fed some additional volume. Set up DEBP and [facility]. Suggest as long as she is using NS she needs to pump 4-6 times a day.   Give baby back volume pumped at next feeding.  Assisted w/ latching baby on L side with #20NS. Baby fell asleep after a few minutes of breastfeeding. Discussed if baby continues to be this sleepy at the breast and mother is not pumping a lot of volume - he will need to be supplemented w/ formula. Provided mother with volume guidelines. Discussed cleaning pump parts and milk storage. Provided mother with another #20 NS and [facility]. Suggest she call when she is ready to rent pump.    Patient Name: Terry Mcdaniel NWGNF'AToday's Date: 09/16/2016     Maternal Data    Feeding    LATCH Score/Interventions                      Lactation Tools Discussed/Used     Consult Status      Terry Mcdaniel, Terry Mcdaniel 09/16/2016, 10:52 AM

## 2016-09-19 ENCOUNTER — Inpatient Hospital Stay (HOSPITAL_COMMUNITY): Admission: RE | Admit: 2016-09-19 | Payer: Managed Care, Other (non HMO) | Source: Ambulatory Visit

## 2016-10-25 ENCOUNTER — Other Ambulatory Visit: Payer: Self-pay | Admitting: Obstetrics and Gynecology

## 2019-05-29 ENCOUNTER — Other Ambulatory Visit: Payer: Self-pay

## 2019-05-29 DIAGNOSIS — Z20822 Contact with and (suspected) exposure to covid-19: Secondary | ICD-10-CM

## 2019-05-31 LAB — NOVEL CORONAVIRUS, NAA: SARS-CoV-2, NAA: NOT DETECTED

## 2019-06-08 ENCOUNTER — Other Ambulatory Visit: Payer: Self-pay | Admitting: Student

## 2019-06-08 ENCOUNTER — Emergency Department (HOSPITAL_COMMUNITY)
Admission: EM | Admit: 2019-06-08 | Discharge: 2019-06-08 | Disposition: A | Discharge status: Home / Self Care | Payer: 59 | Attending: Emergency Medicine | Admitting: Emergency Medicine

## 2019-06-08 ENCOUNTER — Other Ambulatory Visit: Payer: Self-pay

## 2019-06-08 DIAGNOSIS — N760 Acute vaginitis: Secondary | ICD-10-CM

## 2019-06-08 DIAGNOSIS — Z79899 Other long term (current) drug therapy: Secondary | ICD-10-CM | POA: Insufficient documentation

## 2019-06-08 DIAGNOSIS — N751 Abscess of Bartholin's gland: Secondary | ICD-10-CM | POA: Diagnosis not present

## 2019-06-08 DIAGNOSIS — R102 Pelvic and perineal pain: Secondary | ICD-10-CM | POA: Diagnosis present

## 2019-06-08 DIAGNOSIS — J45909 Unspecified asthma, uncomplicated: Secondary | ICD-10-CM | POA: Diagnosis not present

## 2019-06-08 DIAGNOSIS — B9689 Other specified bacterial agents as the cause of diseases classified elsewhere: Secondary | ICD-10-CM

## 2019-06-08 LAB — COMPREHENSIVE METABOLIC PANEL
ALT: 18 U/L (ref 0–44)
AST: 22 U/L (ref 15–41)
Albumin: 3.3 g/dL — ABNORMAL LOW (ref 3.5–5.0)
Alkaline Phosphatase: 70 U/L (ref 38–126)
Anion gap: 6 (ref 5–15)
BUN: 6 mg/dL (ref 6–20)
CO2: 25 mmol/L (ref 22–32)
Calcium: 9.3 mg/dL (ref 8.9–10.3)
Chloride: 108 mmol/L (ref 98–111)
Creatinine, Ser: 0.81 mg/dL (ref 0.44–1.00)
GFR calc Af Amer: >60 mL/min (ref >60)
GFR calc non Af Amer: >60 mL/min (ref >60)
Glucose, Bld: 93 mg/dL (ref 70–99)
Potassium: 4 mmol/L (ref 3.5–5.1)
Sodium: 139 mmol/L (ref 135–145)
Total Bilirubin: 0.4 mg/dL (ref 0.3–1.2)
Total Protein: 6.9 g/dL (ref 6.5–8.1)

## 2019-06-08 LAB — CBC WITH DIFFERENTIAL/PLATELET
Abs Immature Granulocytes: 0.07 10*3/uL (ref 0.00–0.07)
Basophils Absolute: 0 10*3/uL (ref 0.0–0.1)
Basophils Relative: 0 %
Eosinophils Absolute: 0 10*3/uL (ref 0.0–0.5)
Eosinophils Relative: 0 %
HCT: 40.4 % (ref 36.0–46.0)
Hemoglobin: 13.4 g/dL (ref 12.0–15.0)
Immature Granulocytes: 1 %
Lymphocytes Relative: 23 %
Lymphs Abs: 2.8 10*3/uL (ref 0.7–4.0)
MCH: 33.2 pg (ref 26.0–34.0)
MCHC: 33.2 g/dL (ref 30.0–36.0)
MCV: 100 fL (ref 80.0–100.0)
Monocytes Absolute: 0.8 10*3/uL (ref 0.1–1.0)
Monocytes Relative: 7 %
Neutro Abs: 8.3 10*3/uL — ABNORMAL HIGH (ref 1.7–7.7)
Neutrophils Relative %: 69 %
Platelets: 224 10*3/uL (ref 150–400)
RBC: 4.04 MIL/uL (ref 3.87–5.11)
RDW: 13.5 % (ref 11.5–15.5)
WBC: 12.1 10*3/uL — ABNORMAL HIGH (ref 4.0–10.5)
nRBC: 0 % (ref 0.0–0.2)

## 2019-06-08 LAB — LACTIC ACID, PLASMA: Lactic Acid, Venous: 1 mmol/L (ref 0.5–1.9)

## 2019-06-08 LAB — WET PREP, GENITAL
Sperm: NONE SEEN
Trich, Wet Prep: NONE SEEN
Yeast Wet Prep HPF POC: NONE SEEN

## 2019-06-08 LAB — I-STAT BETA HCG BLOOD, ED (MC, WL, AP ONLY): I-stat hCG, quantitative: <5 m[IU]/mL (ref <5)

## 2019-06-08 MED ORDER — LIDOCAINE HCL URETHRAL/MUCOSAL 2 % EX GEL
1.0000 "application " | Freq: Once | CUTANEOUS | Status: AC
  Administered 2019-06-08: 1 via TOPICAL
  Filled 2019-06-08: qty 20

## 2019-06-08 MED ORDER — LIDOCAINE HCL 2 % EX GEL: 1.0000 "application " | CUTANEOUS | 0 refills | Status: AC | PRN

## 2019-06-08 MED ORDER — OXYCODONE-ACETAMINOPHEN 5-325 MG PO TABS: 1.0000 | ORAL_TABLET | Freq: Four times a day (QID) | ORAL | 0 refills | Status: AC | PRN

## 2019-06-08 MED ORDER — METRONIDAZOLE 500 MG PO TABS: 500.0000 mg | ORAL_TABLET | Freq: Two times a day (BID) | ORAL | 0 refills | Status: AC

## 2019-06-08 MED ORDER — MORPHINE SULFATE (PF) 4 MG/ML IV SOLN
4.0000 mg | Freq: Once | INTRAVENOUS | Status: AC
  Administered 2019-06-08: 4 mg via INTRAVENOUS
  Filled 2019-06-08: qty 1

## 2019-06-08 MED ORDER — KETOROLAC TROMETHAMINE 15 MG/ML IJ SOLN
15.0000 mg | Freq: Once | INTRAMUSCULAR | Status: AC
  Administered 2019-06-08: 15 mg via INTRAVENOUS
  Filled 2019-06-08: qty 1

## 2019-06-08 MED ORDER — ONDANSETRON HCL 4 MG/2ML IJ SOLN
4.0000 mg | Freq: Once | INTRAMUSCULAR | Status: AC
  Administered 2019-06-08: 17:00:00 4 mg via INTRAVENOUS
  Filled 2019-06-08: qty 2

## 2019-06-08 NOTE — ED Provider Notes (Addendum)
[facility] Provider Note   CSN: [id number] Arrival date & time: 06/08/19  1144     History   Chief Complaint Chief Complaint  Patient presents with  . Abscess    HPI Terry HanksBriana M Mcdaniel is a 23 y.o. female with a history of asthma, migraines, herpes, prior chlamydia who presents to the emergency department with complaints of progressively worsening abscess to the R labia region since 05/27/19. Patient states she has had a painful firm mass thought to be an abscess that has been progressively worsening and discomfort in size.  She states that she saw her PCP 09/13 for the symptoms, an incision and drainage was attempted fairly unsuccessfully, and she was placed on Bactrim.  She has been taking the antibiotics as prescribed, she ultimately followed up with her OB/GYN Dr. Richardson Doppole who recommended continued antibiotics and follow-up in the clinic within the next 1 week.  Patient states that she completed the antibiotics 2 days prior, she is had no relief of her symptoms, she has been taking Norco and ibuprofen as prescribed by her prior providers without relief.  Pain is worse in certain positions and with movement.  No alleviating factors.  She has had some vaginal discharge as well.  She is currently sexually active with one partner without concern for STDs.  She denies fever, chills, nausea, vomiting, or pelvic pain.      HPI  Past Medical History:  Diagnosis Date  . Asthma   . Herpes   . History of chlamydia   . Migraine     Patient Active Problem List   Diagnosis Date Noted  . SVD (spontaneous vaginal delivery) 09/15/2016  . Post-dates pregnancy 09/14/2016  . Migraine without aura, without mention of intractable migraine without mention of status migrainosus 02/20/2013  . Episodic tension type headache 02/20/2013  . Tics of organic origin 02/20/2013  . Insomnia, unspecified 02/20/2013    Past Surgical History:  Procedure Laterality Date  .  APPENDECTOMY    . LAPAROSCOPIC APPENDECTOMY  05/15/2012   Procedure: APPENDECTOMY LAPAROSCOPIC;  Surgeon: Judie PetitM. Leonia CoronaShuaib Farooqui, MD;  Location: [facility] OR;  Service: Pediatrics;  Laterality: N/A;  . TONSILLECTOMY AND ADENOIDECTOMY       OB History    Gravida  1   Para  1   Term  1   Preterm  0   AB  0   Living  1     SAB  0   TAB  0   Ectopic  0   Multiple  0   Live Births  1            Home Medications    Prior to Admission medications   Medication Sig Start Date End Date Taking? Authorizing Provider  albuterol (PROVENTIL HFA;VENTOLIN HFA) 108 (90 Base) MCG/ACT inhaler Inhale 1-2 puffs into the lungs every 6 (six) hours as needed for wheezing or shortness of breath.    [provider]  ibuprofen (ADVIL,MOTRIN) 600 MG tablet Take 1 tablet (600 mg total) by mouth every 6 (six) hours. 09/16/16   Clemmons, Elmore GuiseLori A, CNM  Prenatal Vit-Fe Fumarate-FA (PRENATAL MULTIVITAMIN) TABS tablet Take 1 tablet by mouth daily at 12 noon.    [provider]    Family History Family History  Problem Relation Age of Onset  . Migraines Mother   . Breast cancer Maternal Grandmother   . Autism Maternal Aunt        High Functioning  . Migraines Paternal Aunt   .  ADD / ADHD Other        In parents  . Tics Other        In parents  . Bipolar disorder Other        Maternal great uncle  . Hearing loss Cousin        Sensorineural in a 1st cousin    Social History Social History   Tobacco Use  . Smoking status: Never Smoker  . Smokeless tobacco: Never Used  Substance Use Topics  . Alcohol use: No  . Drug use: No     Allergies   Penicillins   Review of Systems Review of Systems  Constitutional: Negative for chills and fever.  Respiratory: Negative for shortness of breath.   Cardiovascular: Negative for chest pain.  Gastrointestinal: Negative for abdominal pain, diarrhea, nausea and vomiting.  Genitourinary: Positive for vaginal discharge and vaginal pain.  Negative for vaginal bleeding.  All other systems reviewed and are negative.    Physical Exam Updated Vital Signs BP 126/79 (BP Location: Right Arm)   Pulse 76   Temp 98.5 F (36.9 C) (Oral)   Resp 18   LMP 05/25/2019 (Approximate)   SpO2 100%   Physical Exam Vitals signs and nursing note reviewed. Exam conducted with a chaperone present.  Constitutional:      General: She is not in acute distress.    Appearance: She is well-developed. She is not toxic-appearing.  HENT:     Head: Normocephalic and atraumatic.  Eyes:     General:        Right eye: No discharge.        Left eye: No discharge.     Conjunctiva/sclera: Conjunctivae normal.  Neck:     Musculoskeletal: Neck supple.  Cardiovascular:     Rate and Rhythm: Normal rate and regular rhythm.  Pulmonary:     Effort: Pulmonary effort is normal. No respiratory distress.     Breath sounds: Normal breath sounds. No wheezing, rhonchi or rales.  Abdominal:     General: There is no distension.     Palpations: Abdomen is soft.     Tenderness: There is no abdominal tenderness.  Genitourinary:    Cervix: Discharge (mild white discharge present) present. No cervical motion tenderness, friability, erythema or cervical bleeding.     Adnexa:        Right: No tenderness.         Left: No tenderness.       Comments: Patient has a large extensively sized right sided Bartholin abscess.  There is induration to the vulva.  Mild overlying erythema.  Not overly hot to the touch.  Does not extend completely to the anus. Very tender to palpation. Prior incision site healed. No active drainage.  Skin:    General: Skin is warm and dry.     Findings: No rash.  Neurological:     Mental Status: She is alert.     Comments: Clear speech.   Psychiatric:        Behavior: Behavior normal.    ED Treatments / Results  Labs (all labs ordered are listed, but only abnormal results are displayed) Labs Reviewed  COMPREHENSIVE METABOLIC PANEL -  Abnormal; Notable for the following components:      Result Value   Albumin 3.3 (*)    All other components within normal limits  CBC WITH DIFFERENTIAL/PLATELET - Abnormal; Notable for the following components:   WBC 12.1 (*)    Neutro Abs 8.3 (*)  All other components within normal limits  LACTIC ACID, PLASMA  URINALYSIS, ROUTINE W REFLEX MICROSCOPIC  I-STAT BETA HCG BLOOD, ED ([facility], [facility], [facility] ONLY)    EKG None  Radiology No results found.  Procedures Procedures (including critical care time)  Medications Ordered in ED Medications  morphine 4 MG/ML injection 4 mg (has no administration in time range)  ondansetron (ZOFRAN) injection 4 mg (has no administration in time range)  lidocaine (XYLOCAINE) 2 % jelly 1 application (has no administration in time range)     Initial Impression / Assessment and Plan / ED Course  I have reviewed the triage vital signs and the nursing notes.  Pertinent labs & imaging results that were available during my care of the patient were reviewed by me and considered in my medical decision making (see chart for details).   Patient presents to the emergency department with findings consistent with a right-sided Bartholin abscess which is fairly large in size and exquisitely tender to palpation.  Her work-up in the emergency department per triage has been reviewed, she does have leukocytosis with 12.1 with mild left shift.  Lactic acid WNL.  CMP unremarkable.  She is not pregnant.  Wet prep from pelvic exam consistent with BV, she has been having discharge that has not been coming from the abscess area, will treat with Flagyl.  Given the size of the present abscess and that she has had attempted I&D previously will consult to discuss with OB/GYN for further recommendations.  15:58: CONSULT: Discussed w/ gynecologist Dr. Joya Gaskins with [facility] (on call for patient's OBGYN practice) recommends patient utilize ice in the area & providing patient lidocaine  jelly topically, will have the office call patient tomorrow to schedule her an appointment tomorrow for drainage in office.   Will administer morphine in the ED. Lidocaine jelly also ordered.   Medications have not been administered @ this time.  16:00: Patient care signed out to Dr. Jodi Mourning at change of shift pending medication administration and re-eval for some symptomatic improvement. Discharge instructions have been prepared. Patient & her mother updated on results & plan of care, provided opportunity for questions, patient and her mother confirmed understanding and are in agreement with plan.  Findings and plan of care discussed with supervising physician Dr. Stevie Kern who has evaluated the patient & is in agreement.    Final Clinical Impressions(s) / ED Diagnoses   Final diagnoses:  Bartholin's gland abscess  Bacterial vaginosis    ED Discharge Orders         Ordered    oxyCODONE-acetaminophen (PERCOCET/ROXICET) 5-325 MG tablet  Every 6 hours PRN     06/08/19 1554    metroNIDAZOLE (FLAGYL) 500 MG tablet  2 times daily     06/08/19 1554    lidocaine (XYLOCAINE) 2 % jelly  As needed     06/08/19 1554           Petrucelli, [place] R, PA-C 06/08/19 1606    Petrucelli, [employer] R, PA-C 06/08/19 1640    Milagros Loll, MD 06/09/19 0930

## 2019-06-08 NOTE — Discharge Instructions (Addendum)
You were seen in the emergency department today for what we suspect is a Bartholin gland abscess.  The abscess likely needs to be drained, we called and discussed with [facility] OB[facility]GYN, their office should be calling tomorrow morning to schedule a very close follow-up appointment, if you have not heard from them by midmorning we recommend calling to ensure they have scheduled the appointment.  In the meantime we are sending you home with the following medicines: -Lidocaine jelly: Apply topically to the area of pain every 2-3 hours as needed -Percocet-this is a narcotic/controlled substance medication that has potential addicting qualities.  We recommend that you take 1-2 tablets every 6 hours as needed for severe pain.  Do not drive or operate heavy machinery when taking this medicine as it can be sedating. Do not drink alcohol or take other sedating medications when taking this medicine for safety reasons.  Keep this out of reach of small children.  Please be aware this medicine has Tylenol in it (325 mg/tab) do not exceed the maximum dose of Tylenol in a day per over the counter recommendations should you decide to supplement with Tylenol over the counter.   We are also sending you home with Flagyl, an antibiotic, to treat for bacterial vaginosis on your wet prep, do not drink alcohol with this medicine as it is not safe, please take this as prescribed.  We have prescribed you new medication(s) today. Discuss the medications prescribed today with your pharmacist as they can have adverse effects and interactions with your other medicines including over the counter and prescribed medications. Seek medical evaluation if you start to experience new or abnormal symptoms after taking one of these medicines, seek care immediately if you start to experience difficulty breathing, feeling of your throat closing, facial swelling, or rash as these could be indications of a more serious allergic reaction  We discussed  with the OB/GYN provider on-call, they have also recommended applying ice directly to this area, please route this in a towel do not apply directly to the skin 20 minutes on 40 minutes off.  Please follow-up with OB/GYN tomorrow.  Return to the ER for new or worsening symptoms including but not limited to fever, uncontrollable pain, or any other concerns.

## 2019-06-08 NOTE — ED Triage Notes (Addendum)
Pt here for evaluation of ongoing golf ball sized abscess "between rectum and vagina" since 9/8. Pt has been seen for same by PCP and OB who have tried to drain it without success. Pt finished 10 day course of abx today and has been taking hydrocodone without relief.

## 2019-06-09 ENCOUNTER — Other Ambulatory Visit: Payer: Self-pay | Admitting: Obstetrics and Gynecology

## 2019-06-09 NOTE — Progress Notes (Signed)
I was unable to reach patient by phone.  I left  A message on voice mail.  I instructed the patient to arrive at [place] entrance at 12:30pm  , register in the [place]. DO NOT eat or drink anything after midnight.  I instructed the patient to take the following medications in the am with just enough water to get them down:  Vicodin or PercocetI asked patient to not wear any lotions, powders, cologne, jewelry, piercing, make-up or nail polish.  Wear clean clothes. Brush teeth. I informed patient that there will need to be a driver and someone to stay with him/her for the first 24 hours after surgery.   I instructed  patient to call [phone], in the am if there were any questions or problems.

## 2019-06-10 ENCOUNTER — Encounter (HOSPITAL_COMMUNITY): Payer: Self-pay | Admitting: *Deleted

## 2019-06-10 ENCOUNTER — Encounter (HOSPITAL_COMMUNITY)
Admission: AD | Disposition: A | Discharge status: Home / Self Care | Payer: Self-pay | Source: Ambulatory Visit | Attending: Obstetrics and Gynecology

## 2019-06-10 ENCOUNTER — Other Ambulatory Visit (HOSPITAL_COMMUNITY)
Admission: RE | Admit: 2019-06-10 | Discharge: 2019-06-10 | Disposition: A | Discharge status: Home / Self Care | Payer: 59 | Source: Ambulatory Visit | Attending: Obstetrics and Gynecology | Admitting: Obstetrics and Gynecology

## 2019-06-10 ENCOUNTER — Other Ambulatory Visit: Payer: Self-pay | Admitting: Obstetrics and Gynecology

## 2019-06-10 ENCOUNTER — Ambulatory Visit (HOSPITAL_COMMUNITY): Payer: 59 | Admitting: Anesthesiology

## 2019-06-10 ENCOUNTER — Emergency Department (HOSPITAL_COMMUNITY)
Admission: EM | Admit: 2019-06-10 | Discharge: 2019-06-10 | Disposition: A | Discharge status: Home / Self Care | Payer: 59 | Attending: Emergency Medicine | Admitting: Emergency Medicine

## 2019-06-10 ENCOUNTER — Other Ambulatory Visit: Payer: Self-pay

## 2019-06-10 ENCOUNTER — Observation Stay (HOSPITAL_COMMUNITY)
Admission: AD | Admit: 2019-06-10 | Discharge: 2019-06-11 | Disposition: A | Discharge status: Home / Self Care | Payer: 59 | Source: Ambulatory Visit | Attending: Obstetrics and Gynecology | Admitting: Obstetrics and Gynecology

## 2019-06-10 ENCOUNTER — Ambulatory Visit: Admit: 2019-06-10 | Payer: Managed Care, Other (non HMO) | Admitting: Obstetrics and Gynecology

## 2019-06-10 DIAGNOSIS — F952 Tourette's disorder: Secondary | ICD-10-CM | POA: Diagnosis not present

## 2019-06-10 DIAGNOSIS — Z20828 Contact with and (suspected) exposure to other viral communicable diseases: Secondary | ICD-10-CM | POA: Diagnosis not present

## 2019-06-10 DIAGNOSIS — Z1624 Resistance to multiple antibiotics: Secondary | ICD-10-CM | POA: Insufficient documentation

## 2019-06-10 DIAGNOSIS — Z79899 Other long term (current) drug therapy: Secondary | ICD-10-CM | POA: Insufficient documentation

## 2019-06-10 DIAGNOSIS — N764 Abscess of vulva: Secondary | ICD-10-CM | POA: Diagnosis not present

## 2019-06-10 DIAGNOSIS — N939 Abnormal uterine and vaginal bleeding, unspecified: Secondary | ICD-10-CM | POA: Diagnosis present

## 2019-06-10 DIAGNOSIS — G43909 Migraine, unspecified, not intractable, without status migrainosus: Secondary | ICD-10-CM | POA: Diagnosis not present

## 2019-06-10 DIAGNOSIS — J45909 Unspecified asthma, uncomplicated: Secondary | ICD-10-CM | POA: Insufficient documentation

## 2019-06-10 DIAGNOSIS — N99820 Postprocedural hemorrhage and hematoma of a genitourinary system organ or structure following a genitourinary system procedure: Secondary | ICD-10-CM | POA: Insufficient documentation

## 2019-06-10 DIAGNOSIS — B9620 Unspecified Escherichia coli [E. coli] as the cause of diseases classified elsewhere: Secondary | ICD-10-CM | POA: Insufficient documentation

## 2019-06-10 DIAGNOSIS — T148XXA Other injury of unspecified body region, initial encounter: Secondary | ICD-10-CM

## 2019-06-10 HISTORY — PX: IRRIGATION AND DEBRIDEMENT HEMATOMA: SHX5254

## 2019-06-10 LAB — CBC WITH DIFFERENTIAL/PLATELET
Abs Immature Granulocytes: 0.1 10*3/uL — ABNORMAL HIGH (ref 0.00–0.07)
Basophils Absolute: 0 10*3/uL (ref 0.0–0.1)
Basophils Relative: 0 %
Eosinophils Absolute: 0 10*3/uL (ref 0.0–0.5)
Eosinophils Relative: 0 %
HCT: 36.4 % (ref 36.0–46.0)
Hemoglobin: 12.5 g/dL (ref 12.0–15.0)
Immature Granulocytes: 1 %
Lymphocytes Relative: 19 %
Lymphs Abs: 2.9 10*3/uL (ref 0.7–4.0)
MCH: 33.7 pg (ref 26.0–34.0)
MCHC: 34.3 g/dL (ref 30.0–36.0)
MCV: 98.1 fL (ref 80.0–100.0)
Monocytes Absolute: 0.9 10*3/uL (ref 0.1–1.0)
Monocytes Relative: 6 %
Neutro Abs: 11.7 10*3/uL — ABNORMAL HIGH (ref 1.7–7.7)
Neutrophils Relative %: 74 %
Platelets: 228 10*3/uL (ref 150–400)
RBC: 3.71 MIL/uL — ABNORMAL LOW (ref 3.87–5.11)
RDW: 13.2 % (ref 11.5–15.5)
WBC: 15.6 10*3/uL — ABNORMAL HIGH (ref 4.0–10.5)
nRBC: 0 % (ref 0.0–0.2)

## 2019-06-10 LAB — ABO/RH: ABO/RH(D): A POS

## 2019-06-10 LAB — CERVICOVAGINAL ANCILLARY ONLY
Chlamydia: NEGATIVE
Neisseria Gonorrhea: NEGATIVE

## 2019-06-10 LAB — I-STAT BETA HCG BLOOD, ED (MC, WL, AP ONLY): I-stat hCG, quantitative: <5 m[IU]/mL (ref <5)

## 2019-06-10 LAB — PROTIME-INR
INR: 1.1 (ref 0.8–1.2)
Prothrombin Time: 14.2 seconds (ref 11.4–15.2)

## 2019-06-10 LAB — I-STAT CHEM 8, ED
BUN: 12 mg/dL (ref 6–20)
Calcium, Ion: 1.08 mmol/L — ABNORMAL LOW (ref 1.15–1.40)
Chloride: 105 mmol/L (ref 98–111)
Creatinine, Ser: 0.7 mg/dL (ref 0.44–1.00)
Glucose, Bld: 99 mg/dL (ref 70–99)
HCT: 40 % (ref 36.0–46.0)
Hemoglobin: 13.6 g/dL (ref 12.0–15.0)
Potassium: 3.6 mmol/L (ref 3.5–5.1)
Sodium: 139 mmol/L (ref 135–145)
TCO2: 21 mmol/L — ABNORMAL LOW (ref 22–32)

## 2019-06-10 LAB — CBC
HCT: 34.6 % — ABNORMAL LOW (ref 36.0–46.0)
Hemoglobin: 11.2 g/dL — ABNORMAL LOW (ref 12.0–15.0)
MCH: 32.7 pg (ref 26.0–34.0)
MCHC: 32.4 g/dL (ref 30.0–36.0)
MCV: 101.2 fL — ABNORMAL HIGH (ref 80.0–100.0)
Platelets: 269 10*3/uL (ref 150–400)
RBC: 3.42 MIL/uL — ABNORMAL LOW (ref 3.87–5.11)
RDW: 13.3 % (ref 11.5–15.5)
WBC: 17.5 10*3/uL — ABNORMAL HIGH (ref 4.0–10.5)
nRBC: 0 % (ref 0.0–0.2)

## 2019-06-10 LAB — TYPE AND SCREEN
ABO/RH(D): A POS
Antibody Screen: NEGATIVE

## 2019-06-10 LAB — BASIC METABOLIC PANEL
Anion gap: 13 (ref 5–15)
BUN: 12 mg/dL (ref 6–20)
CO2: 20 mmol/L — ABNORMAL LOW (ref 22–32)
Calcium: 8.7 mg/dL — ABNORMAL LOW (ref 8.9–10.3)
Chloride: 105 mmol/L (ref 98–111)
Creatinine, Ser: 0.84 mg/dL (ref 0.44–1.00)
GFR calc Af Amer: >60 mL/min (ref >60)
GFR calc non Af Amer: >60 mL/min (ref >60)
Glucose, Bld: 104 mg/dL — ABNORMAL HIGH (ref 70–99)
Potassium: 3.6 mmol/L (ref 3.5–5.1)
Sodium: 138 mmol/L (ref 135–145)

## 2019-06-10 LAB — SARS CORONAVIRUS 2 BY RT PCR (HOSPITAL ORDER, PERFORMED IN ~~LOC~~ HOSPITAL LAB): SARS Coronavirus 2: NEGATIVE

## 2019-06-10 SURGERY — IRRIGATION AND DEBRIDEMENT HEMATOMA
Anesthesia: General | Site: Vulva

## 2019-06-10 MED ORDER — PROMETHAZINE HCL 25 MG/ML IJ SOLN: 6.2500 mg | INTRAMUSCULAR | Status: DC | PRN

## 2019-06-10 MED ORDER — FENTANYL CITRATE (PF) 100 MCG/2ML IJ SOLN
50.0000 ug | Freq: Once | INTRAMUSCULAR | Status: AC
  Administered 2019-06-10: 50 ug via INTRAVENOUS
  Filled 2019-06-10: qty 2

## 2019-06-10 MED ORDER — ONDANSETRON HCL 4 MG PO TABS: 4.0000 mg | ORAL_TABLET | Freq: Four times a day (QID) | ORAL | Status: DC | PRN

## 2019-06-10 MED ORDER — PHENYLEPHRINE 40 MCG/ML (10ML) SYRINGE FOR IV PUSH (FOR BLOOD PRESSURE SUPPORT)
PREFILLED_SYRINGE | INTRAVENOUS | Status: AC
  Filled 2019-06-10: qty 10

## 2019-06-10 MED ORDER — PROPOFOL 10 MG/ML IV BOLUS
INTRAVENOUS | Status: AC
  Filled 2019-06-10: qty 20

## 2019-06-10 MED ORDER — HYDROMORPHONE HCL 1 MG/ML IJ SOLN: 0.2000 mg | INTRAMUSCULAR | Status: DC | PRN

## 2019-06-10 MED ORDER — MIDAZOLAM HCL 2 MG/2ML IJ SOLN
INTRAMUSCULAR | Status: AC
  Filled 2019-06-10: qty 2

## 2019-06-10 MED ORDER — ACETAMINOPHEN 325 MG PO TABS: 650.0000 mg | ORAL_TABLET | ORAL | Status: DC | PRN

## 2019-06-10 MED ORDER — ONDANSETRON HCL 4 MG/2ML IJ SOLN
4.0000 mg | Freq: Once | INTRAMUSCULAR | Status: AC
  Administered 2019-06-10: 04:00:00 4 mg via INTRAVENOUS
  Filled 2019-06-10: qty 2

## 2019-06-10 MED ORDER — ALBUTEROL SULFATE HFA 108 (90 BASE) MCG/ACT IN AERS: 1.0000 | INHALATION_SPRAY | Freq: Four times a day (QID) | RESPIRATORY_TRACT | Status: DC | PRN

## 2019-06-10 MED ORDER — CIPROFLOXACIN IN D5W 400 MG/200ML IV SOLN
INTRAVENOUS | Status: AC
  Filled 2019-06-10: qty 200

## 2019-06-10 MED ORDER — LIDOCAINE-EPINEPHRINE (PF) 2 %-1:200000 IJ SOLN
10.0000 mL | Freq: Once | INTRAMUSCULAR | Status: AC
  Administered 2019-06-10: 10 mL via INTRADERMAL
  Filled 2019-06-10: qty 20

## 2019-06-10 MED ORDER — SODIUM CHLORIDE 0.9 % IV BOLUS (SEPSIS)
1000.0000 mL | Freq: Once | INTRAVENOUS | Status: AC
  Administered 2019-06-10: 1000 mL via INTRAVENOUS

## 2019-06-10 MED ORDER — LACTATED RINGERS IV SOLN: INTRAVENOUS | Status: DC

## 2019-06-10 MED ORDER — LIDOCAINE HCL (CARDIAC) PF 100 MG/5ML IV SOSY
PREFILLED_SYRINGE | INTRAVENOUS | Status: DC | PRN
  Administered 2019-06-10: 100 mg via INTRAVENOUS

## 2019-06-10 MED ORDER — HYDROMORPHONE HCL 1 MG/ML IJ SOLN: 0.2500 mg | INTRAMUSCULAR | Status: DC | PRN

## 2019-06-10 MED ORDER — PROPOFOL 10 MG/ML IV BOLUS
INTRAVENOUS | Status: DC | PRN
  Administered 2019-06-10: 200 mg via INTRAVENOUS

## 2019-06-10 MED ORDER — FENTANYL CITRATE (PF) 250 MCG/5ML IJ SOLN
INTRAMUSCULAR | Status: AC
  Filled 2019-06-10: qty 5

## 2019-06-10 MED ORDER — TRANEXAMIC ACID 1000 MG/10ML IV SOLN
500.0000 mg | Freq: Once | INTRAVENOUS | Status: AC
  Administered 2019-06-10: 500 mg via TOPICAL
  Filled 2019-06-10: qty 10

## 2019-06-10 MED ORDER — LACTATED RINGERS IV SOLN
INTRAVENOUS | Status: DC | PRN
  Administered 2019-06-10: 13:00:00 via INTRAVENOUS

## 2019-06-10 MED ORDER — OXYCODONE HCL 5 MG/5ML PO SOLN: 5.0000 mg | Freq: Once | ORAL | Status: DC | PRN

## 2019-06-10 MED ORDER — DEXAMETHASONE SODIUM PHOSPHATE 10 MG/ML IJ SOLN
INTRAMUSCULAR | Status: DC | PRN
  Administered 2019-06-10: 10 mg via INTRAVENOUS

## 2019-06-10 MED ORDER — METRONIDAZOLE IN NACL 5-0.79 MG/ML-% IV SOLN
500.0000 mg | INTRAVENOUS | Status: AC
  Administered 2019-06-10: 16:00:00 500 mg via INTRAVENOUS
  Filled 2019-06-10: qty 100

## 2019-06-10 MED ORDER — MIDAZOLAM HCL 5 MG/5ML IJ SOLN
INTRAMUSCULAR | Status: DC | PRN
  Administered 2019-06-10: 2 mg via INTRAVENOUS

## 2019-06-10 MED ORDER — ALUM & MAG HYDROXIDE-SIMETH 200-200-20 MG/5ML PO SUSP: 30.0000 mL | ORAL | Status: DC | PRN

## 2019-06-10 MED ORDER — OXYCODONE HCL 5 MG PO TABS: 5.0000 mg | ORAL_TABLET | Freq: Once | ORAL | Status: DC | PRN

## 2019-06-10 MED ORDER — LIDOCAINE HCL URETHRAL/MUCOSAL 2 % EX GEL
1.0000 "application " | Freq: Once | CUTANEOUS | Status: AC
  Administered 2019-06-10: 1 via TOPICAL
  Filled 2019-06-10: qty 20

## 2019-06-10 MED ORDER — FENTANYL CITRATE (PF) 100 MCG/2ML IJ SOLN
INTRAMUSCULAR | Status: DC | PRN
  Administered 2019-06-10 (×2): 100 ug via INTRAVENOUS
  Administered 2019-06-10: 50 ug via INTRAVENOUS

## 2019-06-10 MED ORDER — OXYCODONE HCL 5 MG PO TABS: 5.0000 mg | ORAL_TABLET | ORAL | Status: DC | PRN

## 2019-06-10 MED ORDER — ONDANSETRON HCL 4 MG/2ML IJ SOLN: 4.0000 mg | Freq: Four times a day (QID) | INTRAMUSCULAR | Status: DC | PRN

## 2019-06-10 MED ORDER — CIPROFLOXACIN HCL 500 MG PO TABS
500.0000 mg | ORAL_TABLET | Freq: Two times a day (BID) | ORAL | Status: DC
  Administered 2019-06-11: 08:00:00 500 mg via ORAL
  Filled 2019-06-10: qty 1

## 2019-06-10 MED ORDER — LACTATED RINGERS IV SOLN
INTRAVENOUS | Status: DC
  Administered 2019-06-10: 22:00:00 via INTRAVENOUS

## 2019-06-10 MED ORDER — PHENYLEPHRINE 40 MCG/ML (10ML) SYRINGE FOR IV PUSH (FOR BLOOD PRESSURE SUPPORT)
PREFILLED_SYRINGE | INTRAVENOUS | Status: DC | PRN
  Administered 2019-06-10: 80 ug via INTRAVENOUS
  Administered 2019-06-10: 120 ug via INTRAVENOUS
  Administered 2019-06-10 (×4): 80 ug via INTRAVENOUS

## 2019-06-10 MED ORDER — METRONIDAZOLE 500 MG PO TABS: 500.0000 mg | ORAL_TABLET | Freq: Two times a day (BID) | ORAL | Status: DC

## 2019-06-10 MED ORDER — KETOROLAC TROMETHAMINE 30 MG/ML IJ SOLN: 30.0000 mg | Freq: Once | INTRAMUSCULAR | Status: DC

## 2019-06-10 MED ORDER — SIMETHICONE 80 MG PO CHEW: 80.0000 mg | CHEWABLE_TABLET | Freq: Four times a day (QID) | ORAL | Status: DC | PRN

## 2019-06-10 MED ORDER — ONDANSETRON HCL 4 MG/2ML IJ SOLN
INTRAMUSCULAR | Status: DC | PRN
  Administered 2019-06-10: 4 mg via INTRAVENOUS

## 2019-06-10 MED ORDER — LIDOCAINE-EPINEPHRINE 1 %-1:100000 IJ SOLN
INTRAMUSCULAR | Status: DC | PRN
  Administered 2019-06-10: 10 mL
  Administered 2019-06-10: 8 mL

## 2019-06-10 MED ORDER — CIPROFLOXACIN IN D5W 400 MG/200ML IV SOLN
400.0000 mg | INTRAVENOUS | Status: AC
  Administered 2019-06-10: 16:00:00 400 mg via INTRAVENOUS

## 2019-06-10 MED ORDER — MEPERIDINE HCL 25 MG/ML IJ SOLN: 6.2500 mg | INTRAMUSCULAR | Status: DC | PRN

## 2019-06-10 MED ORDER — LIDOCAINE-EPINEPHRINE 1 %-1:100000 IJ SOLN
INTRAMUSCULAR | Status: AC
  Filled 2019-06-10: qty 1

## 2019-06-10 MED ORDER — ZOLPIDEM TARTRATE 5 MG PO TABS: 5.0000 mg | ORAL_TABLET | Freq: Every evening | ORAL | Status: DC | PRN

## 2019-06-10 MED ORDER — ONDANSETRON HCL 4 MG/2ML IJ SOLN
INTRAMUSCULAR | Status: AC
  Filled 2019-06-10: qty 2

## 2019-06-10 MED ORDER — DEXAMETHASONE SODIUM PHOSPHATE 10 MG/ML IJ SOLN
INTRAMUSCULAR | Status: AC
  Filled 2019-06-10: qty 1

## 2019-06-10 MED ORDER — IBUPROFEN 600 MG PO TABS: 600.0000 mg | ORAL_TABLET | Freq: Four times a day (QID) | ORAL | Status: DC | PRN

## 2019-06-10 MED ORDER — KETOROLAC TROMETHAMINE 30 MG/ML IJ SOLN: 30.0000 mg | Freq: Once | INTRAMUSCULAR | Status: DC | PRN

## 2019-06-10 MED ORDER — ALBUTEROL SULFATE (2.5 MG/3ML) 0.083% IN NEBU: 2.5000 mg | INHALATION_SOLUTION | Freq: Four times a day (QID) | RESPIRATORY_TRACT | Status: DC | PRN

## 2019-06-10 MED ORDER — BUPIVACAINE HCL (PF) 0.25 % IJ SOLN
INTRAMUSCULAR | Status: AC
  Filled 2019-06-10: qty 30

## 2019-06-10 MED ORDER — LIDOCAINE 2% (20 MG/ML) 5 ML SYRINGE
INTRAMUSCULAR | Status: AC
  Filled 2019-06-10: qty 5

## 2019-06-10 SURGICAL SUPPLY — 19 items
BLADE 10 SAFETY STRL DISP (BLADE) ×2 IMPLANT
CATH ROBINSON RED A/P 16FR (CATHETERS) ×2 IMPLANT
CONT SPEC 4OZ CLIKSEAL STRL BL (MISCELLANEOUS) ×2 IMPLANT
GAUZE PACKING IODOFORM 1/4X15 (GAUZE/BANDAGES/DRESSINGS) ×2 IMPLANT
GLOVE BIOGEL M 6.5 STRL (GLOVE) ×4 IMPLANT
GLOVE BIOGEL PI IND STRL 6.5 (GLOVE) ×1 IMPLANT
GLOVE BIOGEL PI IND STRL 7.0 (GLOVE) ×1 IMPLANT
GLOVE BIOGEL PI INDICATOR 6.5 (GLOVE) ×1
GLOVE BIOGEL PI INDICATOR 7.0 (GLOVE) ×1
GOWN STRL REUS W/ TWL LRG LVL3 (GOWN DISPOSABLE) ×3 IMPLANT
GOWN STRL REUS W/TWL LRG LVL3 (GOWN DISPOSABLE) ×3
KIT TURNOVER KIT B (KITS) ×2 IMPLANT
PACK VAGINAL MINOR WOMEN LF (CUSTOM PROCEDURE TRAY) ×2 IMPLANT
PAD OB MATERNITY 4.3X12.25 (PERSONAL CARE ITEMS) ×2 IMPLANT
PENCIL BUTTON HOLSTER BLD 10FT (ELECTRODE) ×2 IMPLANT
SUT VIC AB 3-0 SH 27 (SUTURE) ×1
SUT VIC AB 3-0 SH 27X BRD (SUTURE) ×1 IMPLANT
TOWEL GREEN STERILE FF (TOWEL DISPOSABLE) ×4 IMPLANT
UNDERPAD 30X30 (UNDERPADS AND DIAPERS) ×2 IMPLANT

## 2019-06-10 NOTE — Discharge Instructions (Signed)
You were seen in the emergency department for bleeding from a labial incision site.  You did have a small artery that was bleeding that stopped after direct pressure and a medicine apply topically called TXA.  Your hemoglobin today was normal at 13.6 and your vital signs, EKG have been normal.  I recommend that you rest until you return to the hospital at 12:30 PM for preop.  Please do not eat or drink anything prior to your surgery.  If this area begins to bleed again, I recommend direct pressure with gauze or tissue or a towel for at least 20 minutes without stopping.  If this does not stop the bleeding or if you begin bleeding heavily again and soaking through towels, pads, have blood that is bright red and squirting as it was previously, feel like you are going to pass out or do pass out, please return to the emergency department.

## 2019-06-10 NOTE — ED Notes (Signed)
RN ambulated pt, she did will, bleeding did not restart.  Pt stated her chest felt heavy.  Provider bedside, reordered EKG. Done and given to pt.

## 2019-06-10 NOTE — ED Triage Notes (Signed)
Patient states that she is suppose to be here, today, @ 15:00 p.m. for a procedure. Was told to come in today at 12 p.m. for an "admission". C/o vaginal bleeding.

## 2019-06-10 NOTE — Op Note (Signed)
Date of Initial H&P: 09/22/202  History reviewed, patient examined, no change in status, stable for surgery.

## 2019-06-10 NOTE — H&P (Deleted)
  The note originally documented on this encounter has been moved the the encounter in which it belongs.  

## 2019-06-10 NOTE — Anesthesia Postprocedure Evaluation (Signed)
Anesthesia Post Note  Patient: Terry Mcdaniel  Procedure(s) Performed: IRRIGATION AND DEBRIDEMENT LABIAL CYST/ABSCESS, POSSIBLE REMOVAL OF LABIAL CYST (N/A Vulva)     Patient location during evaluation: PACU Anesthesia Type: General Level of consciousness: sedated and patient cooperative Pain management: pain level controlled Vital Signs Assessment: post-procedure vital signs reviewed and stable Respiratory status: spontaneous breathing Cardiovascular status: stable Anesthetic complications: no    Last Vitals:  Vitals:   06/10/19 1734 06/10/19 1749  BP: 103/60 100/61  Pulse: 69 70  Resp: 14 14  Temp:    SpO2: 97% 97%    Last Pain:  Vitals:   06/10/19 1749  TempSrc:   PainSc: Rolfe

## 2019-06-10 NOTE — H&P (Signed)
Subjective: Chief Complaint(s):   Vulvar Abscess   HPI:  Isolation Precautions 1. Is fever present / reported?: No, 2. Are respiratory illness symptom(s) present / reported?: No, 3. Are other symptom(s) present / reported?: No, 5. Has there been reported travel to a High Risk respiratory illness region?: No, 6. Has close* contact with person(s) known to have communicable illness been reported?: No, 7. Did travel or close contact (if applicable) occur within 14 days of symptom onset?: No" label="Respiratory Illness Screening" propId="[id number]" catId="[id number]" encId="[id number]"Respiratory Illness Screening 1. Is fever present / reported? No, 2. Are respiratory illness symptom(s) present / reported? No, 3. Are other symptom(s) present / reported? No, 5. Has there been reported travel to a High Risk respiratory illness region? No, 6. Has close* contact with person(s) known to have communicable illness been reported? No, 7. Did travel or close contact (if applicable) occur within 14 days of symptom onset? No.  General 23 y/o presents for lance and drainage of vulvar abscess Pt. is accompanied by her mother, as she is having a hard time walking on her own.  She was last seen on 06/03/2019, at which time there was a 3cm abcess on the right labia majora an was indurated. There was no remaining fluid. Pt. was advised to continue taking the Bactrim as prescribed. She was seen in the ER last night, 06/08/2019. She was advised this was an abscess but was not drained at this visit.  She has been taking percocet for management of pain. She mentions that she took 3 percocet's yesterday. She states that she has stopped taking the hydrocodone. Pt. states her last dose of percocet was at 2pm today.  Pt. is in visible pain in office today. Pt. states that she has been experiencing burning around the affected area. She has been moaning and groaning in pain for the past week, trying to tolerate the pain while continuing with her  antibiotic.  Pt. mentions that she has been experiencing nausea from the pain, so she has not been eating like she usually would. The last time she had something to eat was 1:30pm.  Upon pelvic examination abcess is indurated. This appears to look worse than at her previous office visit. When this was lanced, there was no drainage noted. Unable to even pack site that was lanced in office today. Incision site was not closed in office today. Current Medication: Taking Ibuprofen 800 MG Tablet 1 tablet with food or milk as needed Orally every 8 hours as needed with food. Valtrex(valACYclovir HCl) 1 GM Tablet 1 tablet Orally every 24 hrs, Notes: as needed. Nexplanon(Etonogestrel) 68 MG Implant as directed Subcutaneous. Sulfamethoxazole-Trimethoprim 800-160 MG Tablet 1 tablet Orally Twice a day. Metronidazole 0.75 % Gel Start week after 5 day course of metronidazole gel. 1 applicator full vaginally twice weekly. Percocet(oxyCODONE-Acetaminophen) 5-325 MG Tablet 1 tablet as needed Orally every 6 hrs. Medication List reviewed and reconciled with the patient. 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 Periods : irregular. LMP 2 weels ago. Birth control Nexplanon placed 01/11/17. Denies Last pap smear date never. Denies Last mammogram date N/A. Abnormal pap smear NA. STD Chlamydia, HSV.   OB History:  Number of pregnancies 1. Pregnancy # 1 vaginal delivery,  boy--Zahier, 7.10lbs.   Surgical History:  tonsillectomy     adenoidectomy     wisdom teeth extraction     appendectomy   Hospitalization:  child birth 09/14/16   Family History:  Father: alive    Mother: alive, lupus    Paternal Medora Father: deceased    Paternal Grand Mother: alive    Maternal Grand Father: deceased     Maternal Grand Mother: deceased, diagnosed with Breast cancer    Brother 1: alive    Sister 1: alive    Son(s): alive 3 yrs    1 brother(s) , 1 sister(s) - healthy. 1 son(s) .   Social History: General no Alcohol.  Children: 1, Boys Zahier 16 months on 01/13/2018.  Caffeine: yes, 3 servings of soda, 2 servings a sweet tea daily.  DIET: no particular dietary program.  Tobacco use cigarettes: Never smoked rare 1x per month.Says she will quit 12/22/15, Tobacco history last updated 06/09/2019.  Marital Status: single.  Recreational drug use: yes, MJ, daily.  OCCUPATION: employed, Lexicographer.  no Exercise.   ROS:  Negative except as stated in HPI.  Objective: Vitals: Wt 119, Wt change -1 lb, Ht 65, BMI 19.80, Temp 97.7, Pulse sitting 92, BP sitting 118/82  Past Results: Examination:  General Examination alert, oriented, NAD " label="CONSTITUTIONAL:" categoryPropId="10089" examid="[id number]"CONSTITUTIONAL: alert, oriented, NAD .  moist, warm" label="SKIN:" categoryPropId="10109" examid="[id number]"SKIN: moist, warm.  Conjunctiva clear" label="EYES:" categoryPropId="21468" examid="[id number]"EYES: Conjunctiva clear.  good I:E efffort noted" label="LUNGS:" categoryPropId="87" examid="[id number]"LUNGS: good I:E efffort noted.  soft, non-tender/non-distended, bowel sounds present " label="ABDOMEN:" categoryPropId="88" examid="[id number]"ABDOMEN: soft, non-tender/non-distended, bowel sounds present .  erythema and edema of right labia majora with induration. possible abcsess .Marland Kitchen Procedure.Marland KitchenMarland KitchenVerbal consent for incision and drainage.Marland Kitchen area is prepped with betadine. hurricaine spray applied.. betadine reapplied.. right labia majora is injected with 10 cc of 1% lidicaine with epi. incision was made with scapel 1.5 cm in length to 2 cm depth.. no drainage noted.. small bleeding noted.. pt is unable to tolerate touch to this area. with attempts to facilitate drainage. she is tearful and did not tolerate  the procedure well.. procedure is aborted.... vagina - pink moist mucosa, no lesions or abnormal discharge,cervix - no discharge or lesions or CMT,adnexa - no masses or tenderness,uterus - nontender and normal size on palpation " label="FEMALE GENITOURINARY:" categoryPropId="13414" examid="[id number]"FEMALE GENITOURINARY: erythema and edema of right labia majora with induration. possible abcsess .Marland Kitchen Procedure.Marland KitchenMarland KitchenVerbal consent for incision and drainage.Marland Kitchen area is prepped with betadine. hurricaine spray applied.. betadine reapplied.. right labia majora is injected with 10 cc of 1% lidicaine with epi. incision was made with scapel 1.5 cm in length to 2 cm depth.. no drainage noted.. small bleeding noted.. pt is unable to tolerate touch to this area. with attempts to facilitate drainage. she is tearful and did not tolerate the procedure well.. procedure is aborted.... vagina - pink moist mucosa, no lesions or abnormal discharge,cervix - no discharge or lesions or CMT,adnexa - no masses or tenderness,uterus - nontender and normal size on palpation .  affect normal, good eye contact" label="PSYCH:" categoryPropId="16316" examid="[id number]"PSYCH: affect normal, good eye contact.  Physical Examination:    Assessment: Assessment:  Vulvar abscess - N76.4 (Primary)     Nausea - R11.0     Plan: Treatment: Vulvar abscess Start Cipro(Ciprofloxacin) Tablet, 500 MG, 1 tablet, Orally, every 12 hrs, 10 day(s), 20, Refills 0 Start Percocet(oxyCODONE-Acetaminophen) Tablet, 5-325 MG, 1 tablet as needed, Orally, every 6 hrs, 5 days, 20 Tablet, Refills 0 Procedure:I & D ABSCESS SKIN,  COMPLICATED OR MULTIPLE Notes: wound culture grew E colo that was resistant to Bactrim. it is sensitive to cipro... D/W pt exam under anesthesia tomorrow with exploration of wound and possible incision and drainage of vulvar abscess. r/b/a discussed including but not limited to infection bleeding as well as need for further surgery. pt voiced  understanding. she is also advised that she may have bleeding from the incisoin today like a menses. .. she is to take ibuprofen 800 mg every 8 hour and percocet 2 po every 6 hours for pain . start cipro immediately. Nausea Start Zofran(Ondansetron HCl) Tablet, 8 MG, 1 tablet as needed, Orally, every 8 hours as needed, 30 day(s), 21 Tablet, Refills 0

## 2019-06-10 NOTE — ED Notes (Signed)
Pt reports that her incision (from I & D yesterday) started bleeding.  She also reports a near syncopal episode at home.

## 2019-06-10 NOTE — ED Provider Notes (Signed)
TIME SEEN: 3:53 AM  CHIEF COMPLAINT: bleeding from incision  HPI: Patient is a 23 y.o. female with h/o migraines, asthma, chlamydia and herpes who presents to the ED with complaints of bleeding from an incision site in her right labia.  Patient has had a right labial cyst since September 8.  There have been several attempts at drainage without success.  Last attempt was in the office by Dr. Gerald Leitz yesterday.  States since that time she has had bleeding from this area that has been bright red.  She states that tonight the bleeding increased and she was unable to get it to stop and has soaked several pads, a towel and then had a near syncopal event while sitting on the toilet.  States she felt very dizzy, lightheaded, nauseated, hot and her vision became dark and blurry.  States that she does not think she lost consciousness fully and her mother caught her before she fell off the toilet.  She felt short of breath with this episode but no chest pain.  No chest pain or shortness of breath currently.  She is not on blood thinners.  It appears she is scheduled for incision and drainage of this right labial cyst today at 3 PM.  Was instructed to go to the admitting office at 12:30 PM.  She states she is not sure if she will be admitted overnight or will be discharged after the procedure.  Denies fevers.  No vomiting or diarrhea.  No vaginal bleeding.  Her last menstrual period was 05/25/2019.  Patient arrives by [facility].  ROS: See HPI Constitutional: no fever  Eyes: no drainage  ENT: no runny nose   Cardiovascular:  no chest pain  Resp: SOB  GI: no vomiting GU: no dysuria Integumentary: no rash  Allergy: no hives  Musculoskeletal: no leg swelling  Neurological: no slurred speech ROS otherwise negative  PAST MEDICAL HISTORY/PAST SURGICAL HISTORY:  Past Medical History:  Diagnosis Date  . Asthma   . Herpes   . History of chlamydia   . Migraine     MEDICATIONS:  Prior to Admission  medications   Medication Sig Start Date End Date Taking? Authorizing Provider  albuterol (PROVENTIL HFA;VENTOLIN HFA) 108 (90 Base) MCG/ACT inhaler Inhale 1-2 puffs into the lungs every 6 (six) hours as needed for wheezing or shortness of breath.    [provider]  HYDROcodone-acetaminophen (NORCO/VICODIN) 5-325 MG tablet Take 1 tablet by mouth every 6 (six) hours as needed. 06/01/19   [provider]  ibuprofen (ADVIL) 800 MG tablet Take 800 mg by mouth every 8 (eight) hours as needed for mild pain or moderate pain.    [provider]  ibuprofen (ADVIL,MOTRIN) 600 MG tablet Take 1 tablet (600 mg total) by mouth every 6 (six) hours. Patient not taking: Reported on 06/08/2019 09/16/16   Clemmons, Lawson Fiscal A, CNM  lidocaine (XYLOCAINE) 2 % jelly Apply 1 application topically as needed. Apply directly to painful area at the outer aspect of the vagina every 2-3 hours as needed for pain. 06/08/19   Petrucelli, Samantha R, PA-C  metroNIDAZOLE (FLAGYL) 500 MG tablet Take 1 tablet (500 mg total) by mouth 2 (two) times daily. 06/08/19   Petrucelli, Samantha R, PA-C  oxyCODONE-acetaminophen (PERCOCET/ROXICET) 5-325 MG tablet Take 1-2 tablets by mouth every 6 (six) hours as needed for severe pain. 06/08/19   Petrucelli, Samantha R, PA-C  sulfamethoxazole-trimethoprim (BACTRIM DS) 800-160 MG tablet Take 1 tablet by mouth 2 (two) times daily. 05/29/19  [provider]  valACYclovir (VALTREX) 1000 MG tablet Take 1,000 mg by mouth daily. 05/26/19   [provider]    ALLERGIES:  Allergies  Allergen Reactions  . Penicillins Hives    Has patient had a PCN reaction causing immediate rash, facial/tongue/throat swelling, SOB or lightheadedness with hypotension: No Has patient had a PCN reaction causing severe rash involving mucus membranes or skin necrosis: No Has patient had a PCN reaction that required hospitalization No Has patient had a PCN reaction occurring within the last  10 years: No If all of the above answers are "NO", then may proceed with Cephalosporin use.     SOCIAL HISTORY:  Social History   Tobacco Use  . Smoking status: Never Smoker  . Smokeless tobacco: Never Used  Substance Use Topics  . Alcohol use: No    FAMILY HISTORY: Family History  Problem Relation Age of Onset  . Migraines Mother   . Breast cancer Maternal Grandmother   . Autism Maternal Aunt        High Functioning  . Migraines Paternal Aunt   . ADD / ADHD Other        In parents  . Tics Other        In parents  . Bipolar disorder Other        Maternal great uncle  . Hearing loss Cousin        Sensorineural in a 1st cousin    EXAM: BP 126/68 (BP Location: Right Arm)   Pulse 78   Temp 98 F (36.7 C) (Oral)   Resp (!) 21   LMP 05/25/2019 (Approximate)   SpO2 100%  CONSTITUTIONAL: Alert and oriented and responds appropriately to questions. Well-appearing; well-nourished HEAD: Normocephalic EYES: Conjunctivae clear, pupils appear equal, EOMI, no conjunctival pallor ENT: normal nose; moist mucous membranes NECK: Supple, no meningismus, no nuchal rigidity, no LAD  CARD: RRR; S1 and S2 appreciated; no murmurs, no clicks, no rubs, no gallops RESP: Normal chest excursion without splinting or tachypnea; breath sounds clear and equal bilaterally; no wheezes, no rhonchi, no rales, no hypoxia or respiratory distress, speaking full sentences ABD/GI: Normal bowel sounds; non-distended; soft, non-tender, no rebound, no guarding, no peritoneal signs, no hepatosplenomegaly GU: Patient has an approximately 1 cm incision site to the right labia that has active bright red arterial bleeding.  Bleeding is slowed slightly with direct pressure but patient is having a hard time tolerating pressure being applied to this area.  There is no purulent drainage.  The labia is significantly swollen and tender without redness, crepitus, warmth. BACK:  The back appears normal and is non-tender to  palpation, there is no CVA tenderness EXT: Normal ROM in all joints; non-tender to palpation; no edema; normal capillary refill; no cyanosis, no calf tenderness or swelling    SKIN: Normal color for age and race; warm; no rash NEURO: Moves all extremities equally PSYCH: The patient's mood and manner are appropriate. Grooming and personal hygiene are appropriate.  MEDICAL DECISION MAKING: Patient here with arterial bleeding from an incision site in the right labia.  She reports a near syncopal event at home and significant bleeding.  She is hemodynamically stable here.  Will check hemoglobin.  Anticipate discussion with on-call OB/GYN for Dr. Richardson Dopp.  ED PROGRESS:4:45 AM Hemoglobin is 13.6.  Will discuss with OB/GYN on call.  Patient's bleeding seems to have stopped now while at rest after topical TXA.  EKG shows no ischemic abnormality, arrhythmia or concerning interval abnormality.  5:00 AM  D/w Dr. Everett Graff with OB/GYN on call for Dr. Landry Mellow.  She does not feel the patient needs to be admitted for this at this time.  She states that if patient's bleeding is controlled and she remains hemodynamically stable that the patient can be discharged and return to the hospital at 12:30 PM today as scheduled.  Will attempt to ambulate patient in the emergency department prior to discharge to see if there is any recurrence of bleeding.  If she does have recurrence of bleeding, Dr. Mancel Bale recommends putting lidocaine on the wound and putting monsel's solution inside of the wound.  6:20 AM  Pt able to ambulate without difficulty and has no recurrent bleeding.  Lidocaine applied to this area to help with any further bleeding and for pain.  Will give second dose of fentanyl prior to discharge.  While walking she did states she felt like her chest was tight and she felt tired.  Immediately hooked back up to the monitor and sats 100% on room air, heart rate in the 90s with an unchanged EKG and normal blood pressure  of 136/87.  I feel she is safe to be discharged and follow-up as scheduled today at 12:30 PM for her operation at 3 PM.  She understands that she needs to continue to be n.p.o.  Discussed bleeding return precautions.  Discussed if bleeding returns that she should hold direct pressure for at least 20 minutes without stopping and if arterial bleeding/heavy bleeding is still present, she should return to the emergency department.  At this time, I do not feel there is any life-threatening condition present. I have reviewed and discussed all results (EKG, imaging, lab, urine as appropriate) and exam findings with patient/family. I have reviewed nursing notes and appropriate previous records.  I feel the patient is safe to be discharged home without further emergent workup and can continue workup as an outpatient as needed. Discussed usual and customary return precautions. Patient/family verbalize understanding and are comfortable with this plan.  Outpatient follow-up has been provided as needed. All questions have been answered.   EKG Interpretation  Date/Time:  Tuesday June 10 2019 04:45:09 EDT Ventricular Rate:  74 PR Interval:    QRS Duration: 77 QT Interval:  396 QTC Calculation: 440 R Axis:   77 Text Interpretation:  Sinus rhythm Borderline short PR interval No old tracing to compare Confirmed by Ward, Cyril Mourning [phone]) on 06/10/2019 4:49:15 AM         Ward, Delice Bison, DO 06/10/19 [phone]

## 2019-06-10 NOTE — Transfer of Care (Signed)
Immediate Anesthesia Transfer of Care Note  Patient: Terry Mcdaniel  Procedure(s) Performed: IRRIGATION AND DEBRIDEMENT LABIAL CYST/ABSCESS, POSSIBLE REMOVAL OF LABIAL CYST (N/A Vulva)  Patient Location: PACU  Anesthesia Type:General  Level of Consciousness: oriented, drowsy and patient cooperative  Airway & Oxygen Therapy: Patient Spontanous Breathing and Patient connected to nasal cannula oxygen  Post-op Assessment: Report given to RN and Post -op Vital signs reviewed and stable  Post vital signs: Reviewed  Last Vitals:  Vitals Value Taken Time  BP 123/80 06/10/19 1649  Temp    Pulse 94 06/10/19 1651  Resp 20 06/10/19 1651  SpO2 100 % 06/10/19 1651  Vitals shown include unvalidated device data.  Last Pain:  Vitals:   06/10/19 1256  TempSrc:   PainSc: 10-Worst pain ever      Patients Stated Pain Goal: 3 (21/19/41 7408)  Complications: No apparent anesthesia complications

## 2019-06-10 NOTE — Op Note (Signed)
06/10/2019  4:46 PM  PATIENT:  Terry Mcdaniel  23 y.o. female  PRE-OPERATIVE DIAGNOSIS:  LABIAL CYST  POST-OPERATIVE DIAGNOSIS:  LABIAL CYST  PROCEDURE:  Procedure(s): IRRIGATION AND DEBRIDEMENT LABIAL CYST/ABSCESS, POSSIBLE REMOVAL OF LABIAL CYST (N/A)  SURGEON:  Surgeon(s) and Role:    Christophe Louis, MD - Primary  PHYSICIAN ASSISTANT: None  ASSISTANTS: none   ANESTHESIA:   general  EBL:  10 mL   BLOOD ADMINISTERED:none  DRAINS: none   LOCAL MEDICATIONS USED:  LIDOCAINE   SPECIMEN:  Source of Specimen:  wound culture   DISPOSITION OF SPECIMEN: Lab  COUNTS:  YES  TOURNIQUET:  * No tourniquets in log *  DICTATION: .Dragon Dictation  PLAN OF CARE: Admit for overnight observation  PATIENT DISPOSITION:  PACU - hemodynamically stable.   Delay start of Pharmacological VTE agent (>24hrs) due to surgical blood loss or risk of bleeding: not applicable  Findings: 3-4 cm right labial abscess with induration over labia majora. Normal vaginal mucosa and cervix    Procedure: pt was taken to OR #7 and time out was performed. She was placed in the dorsal lithotomy position prepped and drapped in the usual sterile fashion. The medial aspect of the right labia majora was injected with 10 cc of lidocaine with epi. A 1.5 cm incision was made with the scapel. A copious amount of purulent drainage was expressed. The cutaneous layer was marsupalized to help facilitate further drainage using 3-0 vicryl. The wound was packed with 1/4 inch iodoform. Excellent hemostasis was noted. Counts were correct times 2.

## 2019-06-10 NOTE — Anesthesia Procedure Notes (Signed)
Procedure Name: LMA Insertion Date/Time: 06/10/2019 3:54 PM Performed by: Jenne Campus, CRNA Pre-anesthesia Checklist: Patient identified, Emergency Drugs available, Suction available and Patient being monitored Patient Re-evaluated:Patient Re-evaluated prior to induction Oxygen Delivery Method: Circle System Utilized Preoxygenation: Pre-oxygenation with 100% oxygen Induction Type: IV induction Ventilation: Mask ventilation without difficulty LMA: LMA inserted LMA Size: 4.0 Number of attempts: 1 Placement Confirmation: positive ETCO2 and breath sounds checked- equal and bilateral Tube secured with: Tape Dental Injury: Teeth and Oropharynx as per pre-operative assessment

## 2019-06-10 NOTE — Anesthesia Preprocedure Evaluation (Addendum)
Anesthesia Evaluation  Patient identified by MRN, date of birth, ID band Patient awake    Reviewed: Allergy & Precautions, H&P , NPO status , Patient's Chart, lab work & pertinent test results, reviewed documented beta blocker date and time   History of Anesthesia Complications (+) PONV  Airway Mallampati: I  TM Distance: >3 FB Neck ROM: full    Dental  (+) Teeth Intact, Dental Advisory Given   Pulmonary asthma ,    breath sounds clear to auscultation       Cardiovascular negative cardio ROS   Rhythm:Regular Rate:Normal     Neuro/Psych  Headaches, Mild turrets syndrome negative psych ROS   GI/Hepatic negative GI ROS, Neg liver ROS,   Endo/Other  negative endocrine ROS  Renal/GU negative Renal ROS     Musculoskeletal negative musculoskeletal ROS (+)   Abdominal (+)  Abdomen: soft. Bowel sounds: normal.  Peds  Hematology negative hematology ROS (+)   Anesthesia Other Findings   Reproductive/Obstetrics negative OB ROS                             Anesthesia Physical  Anesthesia Plan  ASA: II  Anesthesia Plan: General   Post-op Pain Management:    Induction: Intravenous  PONV Risk Score and Plan: 4 or greater and Ondansetron, Dexamethasone, Midazolam, Scopolamine patch - Pre-op and Treatment may vary due to age or medical condition  Airway Management Planned: LMA  Additional Equipment: None  Intra-op Plan:   Post-operative Plan: Extubation in OR  Informed Consent: I have reviewed the patients History and Physical, chart, labs and discussed the procedure including the risks, benefits and alternatives for the proposed anesthesia with the patient or authorized representative who has indicated his/her understanding and acceptance.       Plan Discussed with: CRNA and Surgeon  Anesthesia Plan Comments:        Anesthesia Quick Evaluation

## 2019-06-11 ENCOUNTER — Encounter (HOSPITAL_COMMUNITY): Payer: Self-pay | Admitting: Obstetrics and Gynecology

## 2019-06-11 DIAGNOSIS — N764 Abscess of vulva: Secondary | ICD-10-CM | POA: Diagnosis not present

## 2019-06-11 LAB — CBC
HCT: 26.7 % — ABNORMAL LOW (ref 36.0–46.0)
Hemoglobin: 9.2 g/dL — ABNORMAL LOW (ref 12.0–15.0)
MCH: 33.9 pg (ref 26.0–34.0)
MCHC: 34.5 g/dL (ref 30.0–36.0)
MCV: 98.5 fL (ref 80.0–100.0)
Platelets: 189 10*3/uL (ref 150–400)
RBC: 2.71 MIL/uL — ABNORMAL LOW (ref 3.87–5.11)
RDW: 13.2 % (ref 11.5–15.5)
WBC: 9.4 10*3/uL (ref 4.0–10.5)
nRBC: 0 % (ref 0.0–0.2)

## 2019-06-11 NOTE — Discharge Summary (Signed)
Physician Discharge Summary  Patient ID: Terry Mcdaniel MRN: [id number] DOB/AGE: 10-May-1996 23 y.o.  Admit date: 06/10/2019 Discharge date: 06/11/2019  Admission Diagnoses:Labial Abscess / Vulvar pain   Discharge Diagnoses:  Active Problems:   Labial abscess   Discharged Condition: stable  Hospital Course: pt was admitted for observation after undergoing Incision and drainage of Right labial Abscess. She did well postoperatively. Pain was 2-3 out of 10. With minor burning around vulva. She is tolerating po pain is well controlled   Consults: None  Significant Diagnostic Studies: labs: hgb 9.2  Treatments: surgery: Incision and drainage of Right labial Abscess   Discharge Exam: Blood pressure 100/65, pulse 79, temperature 97.6 F (36.4 C), resp. rate 17, height 5\' 5"  (1.651 m), weight 54.7 kg, last menstrual period 05/25/2019, SpO2 98 %, unknown if currently breastfeeding. General appearance: alert, cooperative and no distress Pelvic: Right labia majora is edematous with induration .. the abscess incision site is patent .. packing intact. no active bleeding.  Extremities: extremities normal, atraumatic, no cyanosis or edema Incision/Wound:  Packing in place    Disposition: Discharge disposition: 01-Home or Self Care       Discharge Instructions    Call MD for:  persistant nausea and vomiting   Complete by: As directed    Call MD for:  redness, tenderness, or signs of infection (pain, swelling, redness, odor or green/yellow discharge around incision site)   Complete by: As directed    Call MD for:  severe uncontrolled pain   Complete by: As directed    Call MD for:  temperature >100.4   Complete by: As directed    Diet general   Complete by: As directed    Increase activity slowly   Complete by: As directed    No dressing needed   Complete by: As directed    Sexual Activity Restrictions   Complete by: As directed    Avoid sex for 2-4 weeks until incision has  completely healed     Allergies as of 06/11/2019      Reactions   Penicillins Hives   Has patient had a PCN reaction causing immediate rash, facial/tongue/throat swelling, SOB or lightheadedness with hypotension: No Has patient had a PCN reaction causing severe rash involving mucus membranes or skin necrosis: No Has patient had a PCN reaction that required hospitalization No Has patient had a PCN reaction occurring within the last 10 years: No If all of the above answers are "NO", then may proceed with Cephalosporin use.      Medication List    TAKE these medications   albuterol 108 (90 Base) MCG/ACT inhaler Commonly known as: VENTOLIN HFA Inhale 1-2 puffs into the lungs every 6 (six) hours as needed for wheezing or shortness of breath.   ciprofloxacin 500 MG tablet Commonly known as: CIPRO Take 500 mg by mouth 2 (two) times daily.   ibuprofen 800 MG tablet Commonly known as: ADVIL Take 800 mg by mouth every 8 (eight) hours as needed for mild pain or moderate pain. What changed: Another medication with the same name was removed. Continue taking this medication, and follow the directions you see here.   lidocaine 2 % jelly Commonly known as: XYLOCAINE Apply 1 application topically as needed. Apply directly to painful area at the outer aspect of the vagina every 2-3 hours as needed for pain.   metroNIDAZOLE 500 MG tablet Commonly known as: FLAGYL Take 1 tablet (500 mg total) by mouth 2 (two) times daily.  oxyCODONE-acetaminophen 5-325 MG tablet Commonly known as: PERCOCET/ROXICET Take 1-2 tablets by mouth every 6 (six) hours as needed for severe pain.   valACYclovir 1000 MG tablet Commonly known as: VALTREX Take 1,000 mg by mouth daily.      Follow-up Information    Christophe Louis, MD. Go on 06/12/2019.   Specialty: Obstetrics and Gynecology Why: please come to the office at 9 am on 06/12/2019 to have packing removed  Contact information: [address] [place] [address] [phone]           Signed: Christophe Louis 06/11/2019, 10:12 AM

## 2019-06-11 NOTE — Plan of Care (Signed)
  Problem: Education: Goal: Knowledge of General Education information will improve Description: Including pain rating scale, medication(s)/side effects and non-pharmacologic comfort measures Outcome: Progressing   Problem: Health Behavior/Discharge Planning: Goal: Ability to manage health-related needs will improve Outcome: Progressing   Problem: Clinical Measurements: Goal: Ability to maintain clinical measurements within normal limits will improve Outcome: Progressing Goal: Diagnostic test results will improve Outcome: Progressing Goal: Respiratory complications will improve Outcome: Progressing Goal: Cardiovascular complication will be avoided Outcome: Progressing   Problem: Activity: Goal: Risk for activity intolerance will decrease Outcome: Progressing   Problem: Coping: Goal: Level of anxiety will decrease Outcome: Progressing   Problem: Elimination: Goal: Will not experience complications related to urinary retention Outcome: Progressing   Problem: Pain Managment: Goal: General experience of comfort will improve Outcome: Progressing   Problem: Safety: Goal: Ability to remain free from injury will improve Outcome: Progressing   Problem: Skin Integrity: Goal: Risk for impaired skin integrity will decrease Outcome: Progressing   

## 2019-06-11 NOTE — Progress Notes (Signed)
AVS given and reviewed with pt. Medications discussed. All questions answered to satisfaction. Pt verbalized understanding of information given. Pt to be escorted off the unit via wheelchair by volunteer services. 

## 2019-06-15 LAB — AEROBIC/ANAEROBIC CULTURE W GRAM STAIN (SURGICAL/DEEP WOUND)

## 2020-01-03 ENCOUNTER — Ambulatory Visit: Payer: Managed Care, Other (non HMO) | Attending: Internal Medicine

## 2020-01-03 DIAGNOSIS — Z23 Encounter for immunization: Secondary | ICD-10-CM

## 2020-01-03 NOTE — Progress Notes (Signed)
   Covid-19 Vaccination Clinic  Name:  Terry Mcdaniel    MRN: [id number] DOB: Aug 12, 1996  01/03/2020  [name] was observed post Covid-19 immunization for 15 minutes without incident. She was provided with Vaccine Information Sheet and instruction to access the V-Safe system.   [name] was instructed to call 911 with any severe reactions post vaccine: Marland Kitchen Difficulty breathing  . Swelling of face and throat  . A fast heartbeat  . A bad rash all over body  . Dizziness and weakness   Immunizations Administered    Name Date Dose VIS Date Route   Pfizer COVID-19 Vaccine 01/03/2020  9:54 AM 0.3 mL 08/29/2019 Intramuscular   Manufacturer: [employer], [employer]   Lot: [id number]   NDC: [address]-5

## 2020-01-26 ENCOUNTER — Ambulatory Visit: Payer: Managed Care, Other (non HMO) | Attending: Internal Medicine

## 2020-01-26 DIAGNOSIS — Z23 Encounter for immunization: Secondary | ICD-10-CM

## 2020-01-26 NOTE — Progress Notes (Signed)
   Covid-19 Vaccination Clinic  Name:  AARIN SPARKMAN    MRN: [id number] DOB: 1995/12/02  01/26/2020  Ms. Parmenter was observed post Covid-19 immunization for 15 minutes without incident. She was provided with Vaccine Information Sheet and instruction to access the V-Safe system.   Ms. Vandrunen was instructed to call 911 with any severe reactions post vaccine: Marland Kitchen Difficulty breathing  . Swelling of face and throat  . A fast heartbeat  . A bad rash all over body  . Dizziness and weakness   Immunizations Administered    Name Date Dose VIS Date Route   Pfizer COVID-19 Vaccine 01/26/2020  9:50 AM 0.3 mL 11/12/2018 Intramuscular   Manufacturer: [employer], [employer]   Lot: [id number]   NDC: [address]-0

## 2020-04-07 ENCOUNTER — Other Ambulatory Visit (HOSPITAL_COMMUNITY): Payer: Self-pay | Admitting: Obstetrics and Gynecology

## 2020-08-31 ENCOUNTER — Other Ambulatory Visit: Payer: Self-pay | Admitting: Obstetrics and Gynecology

## 2020-08-31 DIAGNOSIS — M549 Dorsalgia, unspecified: Secondary | ICD-10-CM

## 2020-09-14 ENCOUNTER — Other Ambulatory Visit (HOSPITAL_COMMUNITY): Payer: Self-pay | Admitting: Obstetrics and Gynecology

## 2020-09-16 ENCOUNTER — Other Ambulatory Visit: Payer: Managed Care, Other (non HMO)

## 2021-02-15 ENCOUNTER — Other Ambulatory Visit (HOSPITAL_COMMUNITY): Payer: Self-pay

## 2021-02-15 MED FILL — Medroxyprogesterone Acetate IM Susp 150 MG/ML: INTRAMUSCULAR | 84 days supply | Qty: 1 | Fill #0 | Status: AC

## 2022-05-04 ENCOUNTER — Other Ambulatory Visit: Payer: Self-pay

## 2022-05-05 ENCOUNTER — Other Ambulatory Visit: Payer: Self-pay

## 2022-06-05 ENCOUNTER — Other Ambulatory Visit (HOSPITAL_COMMUNITY): Payer: Self-pay

## 2022-06-12 ENCOUNTER — Other Ambulatory Visit (HOSPITAL_COMMUNITY): Payer: Self-pay

## 2022-06-23 ENCOUNTER — Other Ambulatory Visit (HOSPITAL_COMMUNITY): Payer: Self-pay

## 2022-07-14 ENCOUNTER — Other Ambulatory Visit (HOSPITAL_COMMUNITY): Payer: Self-pay

## 2023-02-17 ENCOUNTER — Other Ambulatory Visit: Payer: Self-pay

## 2023-02-17 ENCOUNTER — Emergency Department (HOSPITAL_COMMUNITY)
Admission: EM | Admit: 2023-02-17 | Discharge: 2023-02-17 | Disposition: A | Discharge status: Home / Self Care | Payer: Commercial Managed Care - HMO | Attending: Emergency Medicine | Admitting: Emergency Medicine

## 2023-02-17 ENCOUNTER — Encounter (HOSPITAL_COMMUNITY): Payer: Self-pay

## 2023-02-17 DIAGNOSIS — L03116 Cellulitis of left lower limb: Secondary | ICD-10-CM | POA: Insufficient documentation

## 2023-02-17 DIAGNOSIS — J45909 Unspecified asthma, uncomplicated: Secondary | ICD-10-CM | POA: Diagnosis not present

## 2023-02-17 DIAGNOSIS — T782XXA Anaphylactic shock, unspecified, initial encounter: Secondary | ICD-10-CM | POA: Insufficient documentation

## 2023-02-17 DIAGNOSIS — T7840XA Allergy, unspecified, initial encounter: Secondary | ICD-10-CM

## 2023-02-17 LAB — I-STAT BETA HCG BLOOD, ED (MC, WL, AP ONLY): I-stat hCG, quantitative: <5 m[IU]/mL (ref <5)

## 2023-02-17 MED ORDER — METHYLPREDNISOLONE SODIUM SUCC 125 MG IJ SOLR
125.0000 mg | Freq: Once | INTRAMUSCULAR | Status: AC
  Administered 2023-02-17: 125 mg via INTRAVENOUS
  Filled 2023-02-17: qty 2

## 2023-02-17 MED ORDER — PREDNISONE 50 MG PO TABS: 50.0000 mg | ORAL_TABLET | Freq: Every day | ORAL | 0 refills | Status: AC

## 2023-02-17 MED ORDER — DOXYCYCLINE HYCLATE 100 MG PO CAPS: 100.0000 mg | ORAL_CAPSULE | Freq: Two times a day (BID) | ORAL | 0 refills | Status: AC

## 2023-02-17 MED ORDER — EPINEPHRINE 0.3 MG/0.3ML IJ SOAJ
0.3000 mg | Freq: Once | INTRAMUSCULAR | Status: AC
  Administered 2023-02-17: 0.3 mg via INTRAMUSCULAR
  Filled 2023-02-17: qty 0.3

## 2023-02-17 MED ORDER — EPINEPHRINE 0.3 MG/0.3ML IJ SOAJ: 0.3000 mg | INTRAMUSCULAR | 0 refills | Status: AC | PRN

## 2023-02-17 MED ORDER — ONDANSETRON HCL 4 MG/2ML IJ SOLN
4.0000 mg | Freq: Once | INTRAMUSCULAR | Status: AC
  Administered 2023-02-17: 4 mg via INTRAVENOUS
  Filled 2023-02-17: qty 2

## 2023-02-17 MED ORDER — SODIUM CHLORIDE 0.9 % IV SOLN: INTRAVENOUS | Status: DC

## 2023-02-17 MED ORDER — DIPHENHYDRAMINE HCL 50 MG/ML IJ SOLN
25.0000 mg | Freq: Once | INTRAMUSCULAR | Status: AC
  Administered 2023-02-17: 25 mg via INTRAVENOUS
  Filled 2023-02-17: qty 1

## 2023-02-17 MED ORDER — EPINEPHRINE 0.3 MG/0.3ML IJ SOAJ
INTRAMUSCULAR | Status: AC
  Filled 2023-02-17: qty 0.3

## 2023-02-17 MED ORDER — SODIUM CHLORIDE 0.9 % IV BOLUS
1000.0000 mL | Freq: Once | INTRAVENOUS | Status: AC
  Administered 2023-02-17: 1000 mL via INTRAVENOUS

## 2023-02-17 MED ORDER — FAMOTIDINE IN NACL 20-0.9 MG/50ML-% IV SOLN
20.0000 mg | Freq: Once | INTRAVENOUS | Status: AC
  Administered 2023-02-17: 20 mg via INTRAVENOUS
  Filled 2023-02-17: qty 50

## 2023-02-17 NOTE — Discharge Instructions (Signed)
Your history, exam, and evaluation are consistent with anaphylactic reaction likely to the antibiotic she received with the sulfa drug in the fluconazole.  Is unclear which medication caused it but likely the sulfa medication.  Please avoid that.  Please take the steroid starting tomorrow as you received tonight's dose already and fill the prescription for EpiPen.  Please rest and stay hydrated and start the new antibiotic for the leg infection and follow-up with your regular doctor.  If any symptoms change or worsen acutely, please return to the nearest emergency department.

## 2023-02-17 NOTE — ED Triage Notes (Signed)
Pt brought in by mother. Patient left lower extremity is infected from a tattoo, she was prescribed two oral prescriptions and a topical to treat. Patient states she took both oral antibiotics today aoround 12pm. Soon after she started to feel dizzy,headache,chills,N/V, red eyes so she decided to lay down.  Upon this RN assessment, patient appears flushed and is shaking. Patient is able to speak in full sentences. No obvious swelling to airway noted. However, she reports feeling short of breath and chest tightness. Airway clear, no wheezing upon auscultation  MD Tegler at bedside for eval.

## 2023-02-17 NOTE — ED Notes (Signed)
Pt feeling a lot better.Condition improved. Denies SOB or chest tightness at this time

## 2023-02-17 NOTE — ED Provider Notes (Signed)
[facility] EMERGENCY DEPARTMENT AT [facility] Provider Note   CSN: [id number] Arrival date & time: 02/17/23  1441     History  Chief Complaint  Patient presents with   Shortness of Breath        Allergic Reaction    Terry Mcdaniel is a 27 y.o. female.  The history is provided by the patient and medical records. No language interpreter was used.  Shortness of Breath Severity:  Severe Onset quality:  Gradual Duration:  1 hour Timing:  Constant Progression:  Worsening Chronicity:  New Context: not URI   Relieved by:  Nothing Worsened by:  Nothing Ineffective treatments:  None tried Associated symptoms: vomiting   Associated symptoms: no abdominal pain, no chest pain, no cough, no fever, no headaches, no neck pain, no rash, no sore throat, no sputum production and no wheezing   Allergic Reaction Presenting symptoms: no rash and no wheezing        Home Medications Prior to Admission medications   Medication Sig Start Date End Date Taking? Authorizing Provider  albuterol (PROVENTIL HFA;VENTOLIN HFA) 108 (90 Base) MCG/ACT inhaler Inhale 1-2 puffs into the lungs every 6 (six) hours as needed for wheezing or shortness of breath.    [provider]  ciprofloxacin (CIPRO) 500 MG tablet Take 500 mg by mouth 2 (two) times daily.    [provider]  ibuprofen (ADVIL) 800 MG tablet Take 800 mg by mouth every 8 (eight) hours as needed for mild pain or moderate pain.    [provider]  lidocaine (XYLOCAINE) 2 % jelly Apply 1 application topically as needed. Apply directly to painful area at the outer aspect of the vagina every 2-3 hours as needed for pain. 06/08/19   Petrucelli, Samantha R, PA-C  medroxyPROGESTERone (DEPO-PROVERA) 150 MG/ML injection INJECT 1 ML INTO THE MUSCLE 09/14/20 09/14/21  Gerald Leitz, MD  medroxyPROGESTERone (DEPO-PROVERA) 150 MG/ML injection INJECT 1 ML INTRAMUSCULAR 30 DAY(S) 04/07/20 05/10/21  Gerald Leitz, MD  metroNIDAZOLE  (FLAGYL) 500 MG tablet Take 1 tablet (500 mg total) by mouth 2 (two) times daily. Patient not taking: Reported on 06/10/2019 06/08/19   Petrucelli, Pleas Koch, PA-C  oxyCODONE-acetaminophen (PERCOCET/ROXICET) 5-325 MG tablet Take 1-2 tablets by mouth every 6 (six) hours as needed for severe pain. 06/08/19   Petrucelli, Samantha R, PA-C  valACYclovir (VALTREX) 1000 MG tablet Take 1,000 mg by mouth daily. 05/26/19   [provider]      Allergies    Penicillins    Review of Systems   Review of Systems  Constitutional:  Negative for chills, fatigue and fever.  HENT:  Negative for congestion and sore throat.        Feeling of throat closing  Respiratory:  Positive for chest tightness and shortness of breath. Negative for cough, sputum production, choking and wheezing.   Cardiovascular:  Negative for chest pain, palpitations and leg swelling.  Gastrointestinal:  Positive for nausea and vomiting. Negative for abdominal pain, constipation and diarrhea.  Genitourinary:  Negative for dysuria and flank pain.  Musculoskeletal:  Negative for back pain, neck pain and neck stiffness.  Skin:  Negative for rash and wound.  Neurological:  Negative for weakness, light-headedness, numbness and headaches.  Psychiatric/Behavioral:  Negative for agitation and confusion.   All other systems reviewed and are negative.   Physical Exam Updated Vital Signs There were no vitals taken for this visit. Physical Exam Vitals and nursing note reviewed.  Constitutional:  General: She is not in acute distress.    Appearance: She is well-developed. She is not ill-appearing, toxic-appearing or diaphoretic.  HENT:     Head: Normocephalic and atraumatic.     Mouth/Throat:     Pharynx: No pharyngeal swelling or oropharyngeal exudate.  Eyes:     Conjunctiva/sclera: Conjunctivae normal.     Pupils: Pupils are equal, round, and reactive to light.  Cardiovascular:     Rate and Rhythm: Regular rhythm. Tachycardia  present.     Heart sounds: No murmur heard. Pulmonary:     Effort: Pulmonary effort is normal. Tachypnea present. No respiratory distress.     Breath sounds: Normal breath sounds. No stridor. No decreased breath sounds, wheezing, rhonchi or rales.  Chest:     Chest wall: No tenderness.  Abdominal:     Palpations: Abdomen is soft.     Tenderness: There is no abdominal tenderness.  Musculoskeletal:        General: No swelling.     Cervical back: Neck supple.     Right lower leg: No tenderness. No edema.     Left lower leg: No tenderness. No edema.  Skin:    General: Skin is warm and dry.     Capillary Refill: Capillary refill takes less than 2 seconds.     Findings: No erythema.  Neurological:     General: No focal deficit present.     Mental Status: She is alert.  Psychiatric:        Mood and Affect: Mood normal.     ED Results / Procedures / Treatments   Labs (all labs ordered are listed, but only abnormal results are displayed) Labs Reviewed  I-STAT BETA HCG BLOOD, ED ([facility], [facility], [facility] ONLY)    EKG EKG Interpretation  Date/Time:  Saturday February 17 2023 14:49:16 EDT Ventricular Rate:  103 PR Interval:  109 QRS Duration: 75 QT Interval:  343 QTC Calculation: 449 R Axis:   70 Text Interpretation: Sinus tachycardia Biatrial enlargement RSR' in V1 or V2, probably normal variant Borderline repolarization abnormality when compared to prior, increased rate and t wave inversions. No STEMI Confirmed by Theda Belfast ([id number]) on 02/17/2023 3:10:45 PM  Radiology No results found.  Procedures Procedures    CRITICAL CARE Performed by: Canary Brim Duana Benedict Total critical care time: 35 minutes Critical care time was exclusive of separately billable procedures and treating other patients. Critical care was necessary to treat or prevent imminent or life-threatening deterioration. Critical care was time spent personally by me on the following activities: development of treatment plan  with patient and/or surrogate as well as nursing, discussions with consultants, evaluation of patient's response to treatment, examination of patient, obtaining history from patient or surrogate, ordering and performing treatments and interventions, ordering and review of laboratory studies, ordering and review of radiographic studies, pulse oximetry and re-evaluation of patient's condition.  Medications Ordered in ED Medications  sodium chloride 0.9 % bolus 1,000 mL (0 mLs Intravenous Stopped 02/17/23 1854)    And  0.9 %  sodium chloride infusion (0 mLs Intravenous Hold 02/17/23 1854)  diphenhydrAMINE (BENADRYL) injection 25 mg (25 mg Intravenous Given 02/17/23 1517)  EPINEPHrine (EPI-PEN) injection 0.3 mg (0.3 mg Intramuscular Given 02/17/23 1516)  ondansetron (ZOFRAN) injection 4 mg (4 mg Intravenous Given 02/17/23 1516)  methylPREDNISolone sodium succinate (SOLU-MEDROL) 125 mg/2 mL injection 125 mg (125 mg Intravenous Given 02/17/23 1517)  famotidine (PEPCID) IVPB 20 mg premix (0 mg Intravenous Stopped 02/17/23 1600)    ED Course/  Medical Decision Making/ A&P                             Medical Decision Making Risk Prescription drug management.    SIMONETTA PATIENCE is a 27 y.o. female with a past medical history significant for migraines, asthma, and penicillin allergy who presents for allergic reaction.  According to patient and mother, patient recently started antibiotics this afternoon for concern for infection related to a tattoo revision on her left leg.  She reports that she was started on a sulfa antibiotic and Diflucan and took her first dose around noon.  Shortly after he started having diffuse itching, nausea, shortness of breath, and feeling like her throat was closing.  She has not had this type reaction before.  She has not taken medications but arrived and is concerned about her breathing.  She denies significant rash aside from the right her leg that is not changed.  Denies any other fevers  or chills currently.  Denies other complaints.  On exam, patient did not have stridor but is feeling that things are tight in her lungs and throat.  Abdomen and chest are nontender.  Patient is slightly tachycardic and tachypneic.  Was not warm to the touch.  Patient otherwise resting but nauseous.  Clinically I am concerned about anaphylaxis to suspected sulfa allergy.  Will give steroids, antihistamines, Zofran, and epinephrine.  Will give some fluids given the lightheadedness and will monitor.  After receiving the medication she reports her symptoms have resolved.  She no longer has the shortness of breath, throat tightness, nausea, or other symptoms.  She has noted feeling sleepy from the Benadryl.  Will continue to monitor for several hours and will let her eat and drink.  Anticipate reassessment after monitoring to determine disposition.  Will also try to look at her leg and decide if she needs different antibiotics versus follow-up with PCP for that decision.         Final Clinical Impression(s) / ED Diagnoses Final diagnoses:  Anaphylaxis, initial encounter  Allergic reaction, initial encounter  Cellulitis of left lower leg    Rx / DC Orders ED Discharge Orders          Ordered    predniSONE (DELTASONE) 50 MG tablet  Daily with breakfast        02/17/23 2014    doxycycline (VIBRAMYCIN) 100 MG capsule  2 times daily        02/17/23 2014    EPINEPHrine 0.3 mg/0.3 mL IJ SOAJ injection  As needed        02/17/23 2014            Clinical Impression: 1. Anaphylaxis, initial encounter   2. Allergic reaction, initial encounter   3. Cellulitis of left lower leg     Disposition: Discharge  Condition: Good  I have discussed the results, Dx and Tx plan with the pt(& family if present). He/she/they expressed understanding and agree(s) with the plan. Discharge instructions discussed at great length. Strict return precautions discussed and pt &/or family have  verbalized understanding of the instructions. No further questions at time of discharge.    New Prescriptions   DOXYCYCLINE (VIBRAMYCIN) 100 MG CAPSULE    Take 1 capsule (100 mg total) by mouth 2 (two) times daily.   EPINEPHRINE 0.3 MG/0.3 ML IJ SOAJ INJECTION    Inject 0.3 mg into the muscle as needed for anaphylaxis.   PREDNISONE (DELTASONE)  50 MG TABLET    Take 1 tablet (50 mg total) by mouth daily with breakfast for 5 days.    Follow Up: Clayborn Heron, MD [address] [place] [address] [phone]     [facility] Emergency Department at [facility] [address] [id number] mc [place] [address] [phone]       Kinslie Hove, Canary Brim, MD 02/17/23 2015

## 2024-01-02 ENCOUNTER — Emergency Department (HOSPITAL_BASED_OUTPATIENT_CLINIC_OR_DEPARTMENT_OTHER)
Admission: EM | Admit: 2024-01-02 | Discharge: 2024-01-02 | Disposition: A | Discharge status: Home / Self Care | Attending: Emergency Medicine | Admitting: Emergency Medicine

## 2024-01-02 ENCOUNTER — Other Ambulatory Visit: Payer: Self-pay

## 2024-01-02 ENCOUNTER — Encounter (HOSPITAL_BASED_OUTPATIENT_CLINIC_OR_DEPARTMENT_OTHER): Payer: Self-pay | Admitting: Emergency Medicine

## 2024-01-02 DIAGNOSIS — S81812A Laceration without foreign body, left lower leg, initial encounter: Secondary | ICD-10-CM | POA: Diagnosis not present

## 2024-01-02 DIAGNOSIS — S8992XA Unspecified injury of left lower leg, initial encounter: Secondary | ICD-10-CM | POA: Diagnosis present

## 2024-01-02 DIAGNOSIS — W268XXA Contact with other sharp object(s), not elsewhere classified, initial encounter: Secondary | ICD-10-CM | POA: Diagnosis not present

## 2024-01-02 MED ORDER — LIDOCAINE HCL (PF) 1 % IJ SOLN
5.0000 mL | Freq: Once | INTRAMUSCULAR | Status: AC
  Administered 2024-01-02: 5 mL
  Filled 2024-01-02: qty 5

## 2024-01-02 NOTE — Discharge Instructions (Addendum)
Suture removal in 8 days  °

## 2024-01-02 NOTE — ED Triage Notes (Signed)
 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

## 2024-01-02 NOTE — ED Provider Notes (Signed)
 [place] [facility]PARTMENT AT [facility] Provider Note   CSN: [id number] Arrival date & time: 01/02/24  1829     History  Chief Complaint  Patient presents with   Laceration    Terry Mcdaniel is a 28 y.o. female.  Patient complains of a laceration to her left lower leg.  Patient reports she was taking the garbage out at her job and something in the trash cut her leg.  Patient reports garbage was from a kindergarten classroom.  She does not think there is any risk of any type of infection.  She suspects she was cut by a plastic utensil.  Patient reports her tetanus shot is up-to-date.  The history is provided by the patient. No language interpreter was used.  Laceration      Home Medications Prior to Admission medications   Medication Sig Start Date End Date Taking? Authorizing Provider  albuterol  (PROVENTIL  HFA;VENTOLIN  HFA) 108 (90 Base) MCG/ACT inhaler Inhale 1-2 puffs into the lungs every 6 (six) hours as needed for wheezing or shortness of breath.    [provider]  ciprofloxacin  (CIPRO ) 500 MG tablet Take 500 mg by mouth 2 (two) times daily.    [provider]  doxycycline  (VIBRAMYCIN ) 100 MG capsule Take 1 capsule (100 mg total) by mouth 2 (two) times daily. 02/17/23   Tegeler, Marine Sia, MD  EPINEPHrine  0.3 mg/0.3 mL IJ SOAJ injection Inject 0.3 mg into the muscle as needed for anaphylaxis. 02/17/23   Tegeler, Marine Sia, MD  ibuprofen  (ADVIL ) 800 MG tablet Take 800 mg by mouth every 8 (eight) hours as needed for mild pain or moderate pain.    [provider]  lidocaine  (XYLOCAINE ) 2 % jelly Apply 1 application topically as needed. Apply directly to painful area at the outer aspect of the vagina every 2-3 hours as needed for pain. 06/08/19   Petrucelli, Samantha R, PA-C  medroxyPROGESTERone  (DEPO-PROVERA ) 150 MG/ML injection INJECT 1 ML INTO THE MUSCLE 09/14/20 09/14/21  Arlee Lace, MD  medroxyPROGESTERone  (DEPO-PROVERA ) 150  MG/ML injection INJECT 1 ML INTRAMUSCULAR 30 DAY(S) 04/07/20 05/10/21  Arlee Lace, MD  metroNIDAZOLE  (FLAGYL ) 500 MG tablet Take 1 tablet (500 mg total) by mouth 2 (two) times daily. Patient not taking: Reported on 06/10/2019 06/08/19   Petrucelli, Samantha R, PA-C  oxyCODONE -acetaminophen  (PERCOCET/ROXICET) 5-325 MG tablet Take 1-2 tablets by mouth every 6 (six) hours as needed for severe pain. 06/08/19   Petrucelli, Samantha R, PA-C  valACYclovir  (VALTREX ) 1000 MG tablet Take 1,000 mg by mouth daily. 05/26/19   [provider]      Allergies    Diflucan [fluconazole], Penicillins, Shellfish-derived products, and Sulfa antibiotics    Review of Systems   Review of Systems  All other systems reviewed and are negative.   Physical Exam Updated Vital Signs BP (!) 132/94 (BP Location: Right Arm)   Pulse 88   Temp 98.8 F (37.1 C) (Oral)   Resp 18   Ht 5\' 5"  (1.651 m)   Wt 63.5 kg   SpO2 100%   BMI 23.30 kg/m  Physical Exam Vitals reviewed.  Constitutional:      Appearance: Normal appearance.  Cardiovascular:     Rate and Rhythm: Normal rate.  Pulmonary:     Effort: Pulmonary effort is normal.  Musculoskeletal:        General: Normal range of motion.  Skin:    Comments: 2 cm laceration left lower leg slight gaping  Neurological:     General:  No focal deficit present.     Mental Status: She is alert.     ED Results / Procedures / Treatments   Labs (all labs ordered are listed, but only abnormal results are displayed) Labs Reviewed - No data to display  EKG None  Radiology No results found.  Procedures .Laceration Repair  Date/Time: 01/02/2024 8:09 PM  Performed by: Sandi Crosby, PA-C Authorized by: Sandi Crosby, PA-C   Consent:    Consent obtained:  Verbal   Consent given by:  Patient   Risks, benefits, and alternatives were discussed: yes     Risks discussed:  Infection   Alternatives discussed:  No treatment Universal protocol:    Procedure  explained and questions answered to patient or proxy's satisfaction: no     Patient identity confirmed:  Verbally with patient Anesthesia:    Anesthesia method:  Local infiltration   Local anesthetic:  Lidocaine  1% w/o epi Laceration details:    Location:  Leg   Leg location:  L lower leg   Length (cm):  2 Pre-procedure details:    Preparation:  Patient was prepped and draped in usual sterile fashion Exploration:    Limited defect created (wound extended): no     Imaging outcome: foreign body not noted     Wound exploration: wound explored through full range of motion   Treatment:    Area cleansed with:  Povidone-iodine   Amount of cleaning:  Standard   Irrigation solution:  Sterile saline Skin repair:    Repair method:  Sutures   Suture size:  5-0   Suture material:  Prolene   Suture technique:  Simple interrupted Approximation:    Approximation:  Loose Repair type:    Repair type:  Simple Post-procedure details:    Dressing:  Sterile dressing     Medications Ordered in ED Medications  lidocaine  (PF) (XYLOCAINE ) 1 % injection 5 mL (has no administration in time range)    ED Course/ Medical Decision Making/ A&P                                 Medical Decision Making Patient reports cutting her leg on something that was in the garbage.  Amount and/or Complexity of Data Reviewed Independent Historian:     Details: Patient is here with her mother who is supportive  Risk Prescription drug management. Risk Details: Patient had 3 sutures placed to left lower leg she is advised suture removal in 8 days.  Patient's tetanus is up-to-date           Final Clinical Impression(s) / ED Diagnoses Final diagnoses:  Laceration of left lower extremity, initial encounter    Rx / DC Orders ED Discharge Orders     None      An After Visit Summary was printed and given to the patient.    Sandi Crosby, PA-C 01/02/24 2011    Owen Blowers P, DO 01/06/24  1426

## 2024-01-10 ENCOUNTER — Emergency Department (HOSPITAL_BASED_OUTPATIENT_CLINIC_OR_DEPARTMENT_OTHER)
Admission: EM | Admit: 2024-01-10 | Discharge: 2024-01-10 | Disposition: A | Discharge status: Home / Self Care | Attending: Emergency Medicine | Admitting: Emergency Medicine

## 2024-01-10 ENCOUNTER — Other Ambulatory Visit: Payer: Self-pay

## 2024-01-10 ENCOUNTER — Encounter (HOSPITAL_BASED_OUTPATIENT_CLINIC_OR_DEPARTMENT_OTHER): Payer: Self-pay | Admitting: Emergency Medicine

## 2024-01-10 DIAGNOSIS — Z4802 Encounter for removal of sutures: Secondary | ICD-10-CM | POA: Diagnosis present

## 2024-01-10 DIAGNOSIS — Z7951 Long term (current) use of inhaled steroids: Secondary | ICD-10-CM | POA: Insufficient documentation

## 2024-01-10 DIAGNOSIS — J45909 Unspecified asthma, uncomplicated: Secondary | ICD-10-CM | POA: Insufficient documentation

## 2024-01-10 NOTE — Discharge Instructions (Addendum)
 You were evaluated in the emergency room for suture removal.  As discussed it does not appear that the sutures are quite ready for removal.  You experience any new or worsening symptoms including fevers, chills, worsening pain, redness or swelling please return to the emergency room.  Otherwise please return in 2 days for suture removal.

## 2024-01-10 NOTE — ED Triage Notes (Signed)
 Pt here for suture removal on left calf.

## 2024-01-10 NOTE — ED Provider Notes (Signed)
 [place] [facility] AT [facility] Provider Note   CSN: [id number] Arrival date & time: 01/10/24  0846     History  Chief Complaint  Patient presents with   Suture / Staple Removal    Terry Mcdaniel is a 28 y.o. female here for suture removal on her left lower extremity.  Sutures were placed on 4/16.  Does not report any drainage, fevers, chills.   Suture / Staple Removal   Past Medical History:  Diagnosis Date   Asthma    Herpes    History of chlamydia    Migraine    Past Surgical History:  Procedure Laterality Date   APPENDECTOMY     IRRIGATION AND DEBRIDEMENT HEMATOMA N/A 06/10/2019   Procedure: IRRIGATION AND DEBRIDEMENT LABIAL CYST/ABSCESS, POSSIBLE REMOVAL OF LABIAL CYST;  Surgeon: Arlee Lace, MD;  Location: [facility] OR;  Service: Gynecology;  Laterality: N/A;   LAPAROSCOPIC APPENDECTOMY  05/15/2012   Procedure: APPENDECTOMY LAPAROSCOPIC;  Surgeon: Melven Stable. Alanda Allegra, MD;  Location: [facility] OR;  Service: Pediatrics;  Laterality: N/A;   TONSILLECTOMY AND ADENOIDECTOMY     WISDOM TOOTH EXTRACTION         Home Medications Prior to Admission medications   Medication Sig Start Date End Date Taking? Authorizing Provider  albuterol  (PROVENTIL  HFA;VENTOLIN  HFA) 108 (90 Base) MCG/ACT inhaler Inhale 1-2 puffs into the lungs every 6 (six) hours as needed for wheezing or shortness of breath.    [provider]  ciprofloxacin  (CIPRO ) 500 MG tablet Take 500 mg by mouth 2 (two) times daily.    [provider]  doxycycline  (VIBRAMYCIN ) 100 MG capsule Take 1 capsule (100 mg total) by mouth 2 (two) times daily. 02/17/23   Tegeler, Marine Sia, MD  EPINEPHrine  0.3 mg/0.3 mL IJ SOAJ injection Inject 0.3 mg into the muscle as needed for anaphylaxis. 02/17/23   Tegeler, Marine Sia, MD  ibuprofen  (ADVIL ) 800 MG tablet Take 800 mg by mouth every 8 (eight) hours as needed for mild pain or moderate pain.    [provider]  lidocaine  (XYLOCAINE ) 2 %  jelly Apply 1 application topically as needed. Apply directly to painful area at the outer aspect of the vagina every 2-3 hours as needed for pain. 06/08/19   Petrucelli, Samantha R, PA-C  medroxyPROGESTERone  (DEPO-PROVERA ) 150 MG/ML injection INJECT 1 ML INTO THE MUSCLE 09/14/20 09/14/21  Arlee Lace, MD  medroxyPROGESTERone  (DEPO-PROVERA ) 150 MG/ML injection INJECT 1 ML INTRAMUSCULAR 30 DAY(S) 04/07/20 05/10/21  Arlee Lace, MD  metroNIDAZOLE  (FLAGYL ) 500 MG tablet Take 1 tablet (500 mg total) by mouth 2 (two) times daily. Patient not taking: Reported on 06/10/2019 06/08/19   Petrucelli, Samantha R, PA-C  oxyCODONE -acetaminophen  (PERCOCET/ROXICET) 5-325 MG tablet Take 1-2 tablets by mouth every 6 (six) hours as needed for severe pain. 06/08/19   Petrucelli, Samantha R, PA-C  valACYclovir  (VALTREX ) 1000 MG tablet Take 1,000 mg by mouth daily. 05/26/19   [provider]      Allergies    Diflucan [fluconazole], Penicillins, Shellfish-derived products, and Sulfa antibiotics    Review of Systems   Review of Systems  Skin:  Positive for wound.    Physical Exam Updated Vital Signs BP (!) 111/93 (BP Location: Right Arm)   Pulse 83   Temp 98.1 F (36.7 C) (Oral)   Resp 17   SpO2 100%  Physical Exam Constitutional:      Appearance: Normal appearance.  HENT:     Head: Normocephalic and atraumatic.  Eyes:  Conjunctiva/sclera: Conjunctivae normal.  Pulmonary:     Effort: Pulmonary effort is normal. No respiratory distress.  Musculoskeletal:     Comments: Left lower extremity with 2 cm laceration with sutures in place.  No surrounding erythema, warmth or drainage.  No tenderness.  Wound does not appear quite healed yet.  Skin:    General: Skin is warm and dry.  Neurological:     Mental Status: She is alert and oriented to person, place, and time.  Psychiatric:        Mood and Affect: Mood normal.     ED Results / Procedures / Treatments   Labs (all labs ordered are listed, but  only abnormal results are displayed) Labs Reviewed - No data to display  EKG None  Radiology No results found.  Procedures Procedures    Medications Ordered in ED Medications - No data to display  ED Course/ Medical Decision Making/ A&P                                 Medical Decision Making  This patient presents to the ED with chief complaint(s) of suture removal.  The complaint involves an extensive differential diagnosis and also carries with it a high risk of complications and morbidity.   pertinent past medical history as listed in HPI  The differential diagnosis includes  Routine healing, dehiscence, cellulitis  Additional history obtained:  Records reviewed Care Everywhere/External Records  Assessment/ Plan:   Patient presents for suture removal in her left lower extremity.  Today is day 8.  On exam there is a sutured laceration on the left lower extremity without any surrounding erythema, warmth or tenderness.  No drainage.  Wound does not appear to have any dehiscence, however it does not appear to be quite well-healed yet.  Appears that the laceration would benefit from another 1 to 2 days with suture placement.  Offered patient suture removal with Dermabond versus Steri-Strips or for patient to return in 2 days for suture removal.  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  Patient will be discharged home. The patient has been appropriately medically screened and/or stabilized in the ED. I have low suspicion for any other emergent medical condition which would require further screening, evaluation or treatment in the ED or require inpatient management. At time of discharge the patient is hemodynamically stable and in no acute distress. I have discussed work-up results and diagnosis with patient and answered all questions. Patient is agreeable with discharge plan. We discussed strict return precautions for returning to the emergency department  and they verbalized understanding.      This note was dictated with voice recognition software.  Despite best efforts at proofreading, errors may have occurred which can change the documentation meaning.          Final Clinical Impression(s) / ED Diagnoses Final diagnoses:  Visit for suture removal    Rx / DC Orders ED Discharge Orders     None         Felicie Horning, PA-C 01/10/24 1000    Scarlette Currier, MD 01/10/24 2222
